# Patient Record
Sex: Female | Born: 1975 | Race: Black or African American | Hispanic: No | Marital: Married | Smoking: Never smoker
Health system: Southern US, Community
[De-identification: ages and names within clinical notes are randomized; demographics above are authoritative.]

## PROBLEM LIST (undated history)

## (undated) DIAGNOSIS — H209 Unspecified iridocyclitis: Secondary | ICD-10-CM

## (undated) DIAGNOSIS — G43909 Migraine, unspecified, not intractable, without status migrainosus: Secondary | ICD-10-CM

## (undated) DIAGNOSIS — E785 Hyperlipidemia, unspecified: Secondary | ICD-10-CM

## (undated) DIAGNOSIS — G4730 Sleep apnea, unspecified: Secondary | ICD-10-CM

## (undated) DIAGNOSIS — E039 Hypothyroidism, unspecified: Secondary | ICD-10-CM

## (undated) DIAGNOSIS — I10 Essential (primary) hypertension: Secondary | ICD-10-CM

## (undated) DIAGNOSIS — K219 Gastro-esophageal reflux disease without esophagitis: Secondary | ICD-10-CM

## (undated) DIAGNOSIS — R768 Other specified abnormal immunological findings in serum: Secondary | ICD-10-CM

## (undated) DIAGNOSIS — F419 Anxiety disorder, unspecified: Secondary | ICD-10-CM

## (undated) DIAGNOSIS — M1990 Unspecified osteoarthritis, unspecified site: Secondary | ICD-10-CM

## (undated) DIAGNOSIS — R011 Cardiac murmur, unspecified: Secondary | ICD-10-CM

## (undated) DIAGNOSIS — E282 Polycystic ovarian syndrome: Secondary | ICD-10-CM

## (undated) HISTORY — PX: DILATION AND CURETTAGE OF UTERUS: SHX78

## (undated) HISTORY — PX: PELVIC LAPAROSCOPY: SHX162

---

## 2014-04-08 ENCOUNTER — Emergency Department (HOSPITAL_COMMUNITY)
Admission: EM | Admit: 2014-04-08 | Discharge: 2014-04-09 | Disposition: A | Discharge status: Home / Self Care | Payer: Private Health Insurance - Indemnity | Attending: Emergency Medicine | Admitting: Emergency Medicine

## 2014-04-08 ENCOUNTER — Emergency Department (HOSPITAL_COMMUNITY): Payer: Private Health Insurance - Indemnity

## 2014-04-08 ENCOUNTER — Encounter (HOSPITAL_COMMUNITY): Payer: Self-pay | Admitting: *Deleted

## 2014-04-08 DIAGNOSIS — E669 Obesity, unspecified: Secondary | ICD-10-CM | POA: Diagnosis not present

## 2014-04-08 DIAGNOSIS — Z79899 Other long term (current) drug therapy: Secondary | ICD-10-CM | POA: Insufficient documentation

## 2014-04-08 DIAGNOSIS — E039 Hypothyroidism, unspecified: Secondary | ICD-10-CM | POA: Insufficient documentation

## 2014-04-08 DIAGNOSIS — R1031 Right lower quadrant pain: Secondary | ICD-10-CM | POA: Diagnosis present

## 2014-04-08 DIAGNOSIS — R1030 Lower abdominal pain, unspecified: Secondary | ICD-10-CM

## 2014-04-08 DIAGNOSIS — Z3202 Encounter for pregnancy test, result negative: Secondary | ICD-10-CM | POA: Diagnosis not present

## 2014-04-08 DIAGNOSIS — N390 Urinary tract infection, site not specified: Secondary | ICD-10-CM | POA: Insufficient documentation

## 2014-04-08 DIAGNOSIS — G43909 Migraine, unspecified, not intractable, without status migrainosus: Secondary | ICD-10-CM | POA: Diagnosis not present

## 2014-04-08 HISTORY — DX: Polycystic ovarian syndrome: E28.2

## 2014-04-08 HISTORY — DX: Hypothyroidism, unspecified: E03.9

## 2014-04-08 HISTORY — DX: Migraine, unspecified, not intractable, without status migrainosus: G43.909

## 2014-04-08 LAB — CBC WITH DIFFERENTIAL/PLATELET
Basophils Absolute: 0 10*3/uL (ref 0.0–0.1)
Basophils Relative: 0 % (ref 0–1)
EOS PCT: 1 % (ref 0–5)
Eosinophils Absolute: 0.1 10*3/uL (ref 0.0–0.7)
HEMATOCRIT: 36 % (ref 36.0–46.0)
HEMOGLOBIN: 12.3 g/dL (ref 12.0–15.0)
LYMPHS ABS: 2.9 10*3/uL (ref 0.7–4.0)
LYMPHS PCT: 32 % (ref 12–46)
MCH: 30.5 pg (ref 26.0–34.0)
MCHC: 34.2 g/dL (ref 30.0–36.0)
MCV: 89.3 fL (ref 78.0–100.0)
MONO ABS: 0.7 10*3/uL (ref 0.1–1.0)
MONOS PCT: 7 % (ref 3–12)
NEUTROS ABS: 5.5 10*3/uL (ref 1.7–7.7)
Neutrophils Relative %: 60 % (ref 43–77)
Platelets: 366 10*3/uL (ref 150–400)
RBC: 4.03 MIL/uL (ref 3.87–5.11)
RDW: 13.7 % (ref 11.5–15.5)
WBC: 9.1 10*3/uL (ref 4.0–10.5)

## 2014-04-08 LAB — URINE MICROSCOPIC-ADD ON

## 2014-04-08 LAB — WET PREP, GENITAL
Clue Cells Wet Prep HPF POC: NONE SEEN
Trich, Wet Prep: NONE SEEN
Yeast Wet Prep HPF POC: NONE SEEN

## 2014-04-08 LAB — URINALYSIS, ROUTINE W REFLEX MICROSCOPIC
Bilirubin Urine: NEGATIVE
Glucose, UA: NEGATIVE mg/dL
KETONES UR: NEGATIVE mg/dL
NITRITE: NEGATIVE
PROTEIN: NEGATIVE mg/dL
Specific Gravity, Urine: 1.02 (ref 1.005–1.030)
UROBILINOGEN UA: 1 mg/dL (ref 0.0–1.0)
pH: 6 (ref 5.0–8.0)

## 2014-04-08 LAB — COMPREHENSIVE METABOLIC PANEL
ALBUMIN: 3.7 g/dL (ref 3.5–5.2)
ALK PHOS: 52 U/L (ref 39–117)
ALT: 18 U/L (ref 0–35)
ANION GAP: 7 (ref 5–15)
AST: 26 U/L (ref 0–37)
BUN: 9 mg/dL (ref 6–23)
CHLORIDE: 107 meq/L (ref 96–112)
CO2: 25 mmol/L (ref 19–32)
CREATININE: 1.15 mg/dL — AB (ref 0.50–1.10)
Calcium: 9.3 mg/dL (ref 8.4–10.5)
GFR calc Af Amer: 69 mL/min — ABNORMAL LOW (ref >90)
GFR calc non Af Amer: 60 mL/min — ABNORMAL LOW (ref >90)
Glucose, Bld: 114 mg/dL — ABNORMAL HIGH (ref 70–99)
Potassium: 3.3 mmol/L — ABNORMAL LOW (ref 3.5–5.1)
Sodium: 139 mmol/L (ref 135–145)
TOTAL PROTEIN: 8 g/dL (ref 6.0–8.3)
Total Bilirubin: 0.3 mg/dL (ref 0.3–1.2)

## 2014-04-08 LAB — POC URINE PREG, ED: Preg Test, Ur: NEGATIVE

## 2014-04-08 LAB — LIPASE, BLOOD: Lipase: 43 U/L (ref 11–59)

## 2014-04-08 MED ORDER — CEPHALEXIN 500 MG PO CAPS: 500.0000 mg | ORAL_CAPSULE | Freq: Two times a day (BID) | ORAL | Status: DC

## 2014-04-08 MED ORDER — IOHEXOL 300 MG/ML  SOLN
25.0000 mL | Freq: Once | INTRAMUSCULAR | Status: AC | PRN
  Administered 2014-04-08: 25 mL via ORAL

## 2014-04-08 MED ORDER — IOHEXOL 300 MG/ML  SOLN
100.0000 mL | Freq: Once | INTRAMUSCULAR | Status: AC | PRN
  Administered 2014-04-08: 100 mL via INTRAVENOUS

## 2014-04-08 MED ORDER — HYDROCODONE-ACETAMINOPHEN 5-325 MG PO TABS: 1.0000 | ORAL_TABLET | Freq: Four times a day (QID) | ORAL | Status: DC | PRN

## 2014-04-08 MED ORDER — MORPHINE SULFATE 4 MG/ML IJ SOLN
4.0000 mg | Freq: Once | INTRAMUSCULAR | Status: AC
Start: 2014-04-08 — End: 2014-04-08
  Administered 2014-04-08: 4 mg via INTRAVENOUS
  Filled 2014-04-08: qty 1

## 2014-04-08 MED ORDER — SODIUM CHLORIDE 0.9 % IV BOLUS (SEPSIS)
1000.0000 mL | Freq: Once | INTRAVENOUS | Status: AC
  Administered 2014-04-08: 1000 mL via INTRAVENOUS

## 2014-04-08 NOTE — ED Provider Notes (Signed)
CSN: [id number]     Arrival date & time 04/08/14  1827 History   First MD Initiated Contact with Patient 04/08/14 1928     Chief Complaint  Patient presents with  . Abdominal Pain     (Consider location/radiation/quality/duration/timing/severity/associated sxs/prior Treatment) Patient is a 39 y.o. female presenting with abdominal pain. The history is provided by the patient and the spouse.  Abdominal Pain Pain location:  Suprapubic and RLQ Pain quality: aching and sharp   Pain radiates to:  Does not radiate Pain severity:  Moderate Onset quality:  Unable to specify Duration: 2.5 weeks. Timing:  Constant Progression:  Unchanged Chronicity:  New Context: not recent travel, not retching and not sick contacts   Relieved by:  Nothing Worsened by:  Nothing tried Ineffective treatments:  None tried Associated symptoms: anorexia, nausea and vaginal bleeding   Associated symptoms: no chest pain, no chills, no constipation, no cough, no diarrhea, no dysuria, no fatigue, no fever, no shortness of breath, no sore throat and no vomiting   Vaginal bleeding:    Quality:  Spotting   Duration: 2.5 weeks.   Progression:  Unchanged   Chronicity:  New Risk factors: obesity   Risk factors comment:  PCOS   Past Medical History  Diagnosis Date  . PCOS (polycystic ovarian syndrome)   . Hypothyroid   . Migraine    Past Surgical History  Procedure Laterality Date  . Cesarean section    . Dilation and curettage of uterus     History reviewed. No pertinent family history. History  Substance Use Topics  . Smoking status: Never Smoker   . Smokeless tobacco: Not on file  . Alcohol Use: No   OB History    No data available     Review of Systems  Constitutional: Negative for fever, chills, diaphoresis, activity change, appetite change and fatigue.  HENT: Negative for congestion, facial swelling, rhinorrhea and sore throat.   Eyes: Negative for photophobia and discharge.  Respiratory:  Negative for cough, chest tightness and shortness of breath.   Cardiovascular: Negative for chest pain, palpitations and leg swelling.  Gastrointestinal: Positive for nausea, abdominal pain and anorexia. Negative for vomiting, diarrhea and constipation.  Endocrine: Negative for polydipsia and polyuria.  Genitourinary: Positive for vaginal bleeding. Negative for dysuria, frequency, difficulty urinating and pelvic pain.  Musculoskeletal: Negative for back pain, arthralgias, neck pain and neck stiffness.  Skin: Negative for color change and wound.  Allergic/Immunologic: Negative for immunocompromised state.  Neurological: Negative for facial asymmetry, weakness, numbness and headaches.  Hematological: Does not bruise/bleed easily.  Psychiatric/Behavioral: Negative for confusion and agitation.      Allergies  Strawberry and Sulfa antibiotics  Home Medications   Prior to Admission medications   Medication Sig Start Date End Date Taking? Authorizing Provider  cephALEXin (KEFLEX) 500 MG capsule Take 1 capsule (500 mg total) by mouth 2 (two) times daily. For 7 days 04/08/14   Ernestina Patches, MD  Cholecalciferol ([employer] VIT D 5000 HIGH-POTENCY) 5000 UNITS capsule Take 5,000 Units by mouth daily.   Yes Historical Provider, MD  glucosamine-chondroitin 500-400 MG tablet Take 1 tablet by mouth daily.   Yes Historical Provider, MD  HYDROcodone-acetaminophen (NORCO) 5-325 MG per tablet Take 1-2 tablets by mouth every 6 (six) hours as needed. 04/08/14   Ernestina Patches, MD  levothyroxine (SYNTHROID, LEVOTHROID) 125 MCG tablet Take 62.5 mcg by mouth daily before breakfast.   Yes Historical Provider, MD  metFORMIN (GLUCOPHAGE) 500 MG tablet Take 500 mg by mouth  2 (two) times daily with a meal.   Yes Historical Provider, MD  Multiple Vitamin (MULTIVITAMIN WITH MINERALS) TABS tablet Take 1 tablet by mouth daily.   Yes Historical Provider, MD  QUASENSE 0.15-0.03 MG tablet Take 1 tablet by mouth daily. 01/02/14  Yes  Historical Provider, MD  spironolactone (ALDACTONE) 100 MG tablet Take 100 mg by mouth at bedtime.   Yes Historical Provider, MD  SUMAtriptan (IMITREX) 25 MG tablet Take 25 mg by mouth every 2 (two) hours as needed for migraine or headache. May repeat in 2 hours if headache persists or recurs.    Historical Provider, MD  topiramate (TOPAMAX) 25 MG tablet Take 75 mg by mouth at bedtime.   Yes Historical Provider, MD  VANIQA 13.9 % cream Apply 1 application topically 2 (two) times daily. 12/30/13  Yes Historical Provider, MD   BP 120/62 mmHg  Pulse 69  Temp(Src) 98.6 F (37 C)  Resp 18  SpO2 100%  LMP 04/08/2014 Physical Exam  Constitutional: She is oriented to person, place, and time. She appears well-developed and well-nourished. No distress.  HENT:  Head: Normocephalic and atraumatic.  Mouth/Throat: No oropharyngeal exudate.  Eyes: Pupils are equal, round, and reactive to light.  Neck: Normal range of motion. Neck supple.  Cardiovascular: Normal rate, regular rhythm and normal heart sounds.  Exam reveals no gallop and no friction rub.   No murmur heard. Pulmonary/Chest: Effort normal and breath sounds normal. No respiratory distress. She has no wheezes. She has no rales.  Abdominal: Soft. Bowel sounds are normal. She exhibits no distension and no mass. There is tenderness in the right lower quadrant and suprapubic area. There is no rebound and no guarding.  Genitourinary: Uterus is not tender. Cervix exhibits no motion tenderness, no discharge and no friability. Right adnexum displays tenderness (mild). Right adnexum displays no mass and no fullness. Left adnexum displays no mass, no tenderness and no fullness. There is bleeding (dark brown menstual appearing blood from closed os) in the vagina.  Musculoskeletal: Normal range of motion. She exhibits no edema or tenderness.  Neurological: She is alert and oriented to person, place, and time.  Skin: Skin is warm and dry.  Psychiatric: She  has a normal mood and affect.    ED Course  Procedures (including critical care time) Labs Review Labs Reviewed  WET PREP, GENITAL - Abnormal; Notable for the following:    WBC, Wet Prep HPF POC MODERATE (*)    All other components within normal limits  COMPREHENSIVE METABOLIC PANEL - Abnormal; Notable for the following:    Potassium 3.3 (*)    Glucose, Bld 114 (*)    Creatinine, Ser 1.15 (*)    GFR calc non Af Amer 60 (*)    GFR calc Af Amer 69 (*)    All other components within normal limits  URINALYSIS, ROUTINE W REFLEX MICROSCOPIC - Abnormal; Notable for the following:    APPearance CLOUDY (*)    Hgb urine dipstick LARGE (*)    Leukocytes, UA MODERATE (*)    All other components within normal limits  URINE MICROSCOPIC-ADD ON - Abnormal; Notable for the following:    Squamous Epithelial / LPF MANY (*)    Bacteria, UA FEW (*)    All other components within normal limits  GC/CHLAMYDIA PROBE AMP  CBC WITH DIFFERENTIAL  LIPASE, BLOOD  POC URINE PREG, ED    Imaging Review Ct Abdomen Pelvis W Contrast  04/08/2014   CLINICAL DATA:  Acute onset of  lower abdominal pain and right-sided abdominal pain. Initial encounter.  EXAM: CT ABDOMEN AND PELVIS WITH CONTRAST  TECHNIQUE: Multidetector CT imaging of the abdomen and pelvis was performed using the standard protocol following bolus administration of intravenous contrast.  CONTRAST:  148mL OMNIPAQUE IOHEXOL 300 MG/ML  SOLN  COMPARISON:  None.  FINDINGS: The visualized lung bases are clear.  The liver and spleen are unremarkable in appearance. The gallbladder is within normal limits. The pancreas and adrenal glands are unremarkable.  The kidneys are unremarkable in appearance. There is no evidence of hydronephrosis. No renal or ureteral stones are seen. No perinephric stranding is appreciated.  No free fluid is identified. The small bowel is unremarkable in appearance. The stomach is within normal limits. No acute vascular abnormalities are  seen. Visualized mediastinal nodes remain borderline normal in size.  The appendix is normal in caliber and contains air, without evidence for appendicitis. The colon is unremarkable in appearance.  The bladder is mildly distended and grossly unremarkable. The uterus is within normal limits. The ovaries are relatively symmetric. No suspicious adnexal masses are seen. No inguinal lymphadenopathy is seen.  No acute osseous abnormalities are identified.  IMPRESSION: No acute abnormality seen within the abdomen or pelvis.   Electronically Signed   By: Garald Balding M.D.   On: 04/08/2014 22:32     EKG Interpretation None      MDM   Final diagnoses:  Lower abdominal pain  RLQ abdominal pain  UTI (lower urinary tract infection)    Pt is a 39 y.o. female with Pmhx as above who presents with 2 weeks of constant suprapubic and right lower quadrant pain described as dull aching pains with occasional sharp pains.  She's had nausea but no vomiting.  She has had vaginal spotting but no other discharge.  She has early [place] when eating.  On physical exam, vital signs are stable.  Patient is in acute distress.  She is positive suprapubic and right lower quadrant tenderness without rebound or guarding.  On pelvic exam she has dark-appearing menstrual blood from a closed loss.  She has minimal right adnexal tenderness and otherwise unremarkable Pelvic exam.  Urine likely infected. CT w/o acute findings. Given adnexal tenderness and no CT findings, will get TV US. Dr Lita Mains will follow and will d/c home w/ rx for keflex if normal.     Ernestina Patches, MD 04/09/14 [phone]

## 2014-04-08 NOTE — ED Notes (Signed)
Started on new birthcontrol in October 2015; starting having lower abd. Pains as well as rt. Sided. Thinking it was like menstrual cycle.

## 2014-04-08 NOTE — Discharge Instructions (Signed)
Abdominal Pain, Women Abdominal (stomach, pelvic, or belly) pain can be caused by many things. It is important to tell your doctor:  The location of the pain.  Does it come and go or is it present all the time?  Are there things that start the pain (eating certain foods, exercise)?  Are there other symptoms associated with the pain (fever, nausea, vomiting, diarrhea)? All of this is helpful to know when trying to find the cause of the pain. CAUSES   Stomach: virus or bacteria infection, or ulcer.  Intestine: appendicitis (inflamed appendix), regional ileitis (Crohn's disease), ulcerative colitis (inflamed colon), irritable bowel syndrome, diverticulitis (inflamed diverticulum of the colon), or cancer of the stomach or intestine.  Gallbladder disease or stones in the gallbladder.  Kidney disease, kidney stones, or infection.  Pancreas infection or cancer.  Fibromyalgia (pain disorder).  Diseases of the female organs:  Uterus: fibroid (non-cancerous) tumors or infection.  Fallopian tubes: infection or tubal pregnancy.  Ovary: cysts or tumors.  Pelvic adhesions (scar tissue).  Endometriosis (uterus lining tissue growing in the pelvis and on the pelvic organs).  Pelvic congestion syndrome (female organs filling up with blood just before the menstrual period).  Pain with the menstrual period.  Pain with ovulation (producing an egg).  Pain with an IUD (intrauterine device, birth control) in the uterus.  Cancer of the female organs.  Functional pain (pain not caused by a disease, may improve without treatment).  Psychological pain.  Depression. DIAGNOSIS  Your doctor will decide the seriousness of your pain by doing an examination.  Blood tests.  X-rays.  Ultrasound.  CT scan (computed tomography, special type of X-ray).  MRI (magnetic resonance imaging).  Cultures, for infection.  Barium enema (dye inserted in the large intestine, to better view it with  X-rays).  Colonoscopy (looking in intestine with a lighted tube).  Laparoscopy (minor surgery, looking in abdomen with a lighted tube).  Major abdominal exploratory surgery (looking in abdomen with a large incision). TREATMENT  The treatment will depend on the cause of the pain.   Many cases can be observed and treated at home.  Over-the-counter medicines recommended by your caregiver.  Prescription medicine.  Antibiotics, for infection.  Birth control pills, for painful periods or for ovulation pain.  Hormone treatment, for endometriosis.  Nerve blocking injections.  Physical therapy.  Antidepressants.  Counseling with a psychologist or psychiatrist.  Minor or major surgery. HOME CARE INSTRUCTIONS   Do not take laxatives, unless directed by your caregiver.  Take over-the-counter pain medicine only if ordered by your caregiver. Do not take aspirin because it can cause an upset stomach or bleeding.  Try a clear liquid diet (broth or water) as ordered by your caregiver. Slowly move to a bland diet, as tolerated, if the pain is related to the stomach or intestine.  Have a thermometer and take your temperature several times a day, and record it.  Bed rest and sleep, if it helps the pain.  Avoid sexual intercourse, if it causes pain.  Avoid stressful situations.  Keep your follow-up appointments and tests, as your caregiver orders.  If the pain does not go away with medicine or surgery, you may try:  Acupuncture.  Relaxation exercises (yoga, meditation).  Group therapy.  Counseling. SEEK MEDICAL CARE IF:   You notice certain foods cause stomach pain.  Your home care treatment is not helping your pain.  You need stronger pain medicine.  You want your IUD removed.  You feel faint or  lightheaded. °· You develop nausea and vomiting. °· You develop a rash. °· You are having side effects or an allergy to your medicine. °SEEK IMMEDIATE MEDICAL CARE IF:  °· Your  pain does not go away or gets worse. °· You have a fever. °· Your pain is felt only in portions of the abdomen. The right side could possibly be appendicitis. The left lower portion of the abdomen could be colitis or diverticulitis. °· You are passing blood in your stools (bright red or black tarry stools, with or without vomiting). °· You have blood in your urine. °· You develop chills, with or without a fever. °· You pass out. °MAKE SURE YOU:  °· Understand these instructions. °· Will watch your condition. °· Will get help right away if you are not doing well or get worse. °Document Released: 01/09/2007 Document Revised: 07/29/2013 Document Reviewed: 01/29/2009 °ExitCare® Patient Information ©2015 [employer]. This information is not intended to replace advice given to you by your health care provider. Make sure you discuss any questions you have with your health care provider. ° °Urinary Tract Infection °Urinary tract infections (UTIs) can develop anywhere along your urinary tract. Your urinary tract is your body's drainage system for removing wastes and extra water. Your urinary tract includes two kidneys, two ureters, a bladder, and a urethra. Your kidneys are a pair of bean-shaped organs. Each kidney is about the size of your fist. They are located below your ribs, one on each side of your spine. °CAUSES °Infections are caused by microbes, which are microscopic organisms, including fungi, viruses, and bacteria. These organisms are so small that they can only be seen through a microscope. Bacteria are the microbes that most commonly cause UTIs. °SYMPTOMS  °Symptoms of UTIs may vary by age and gender of the patient and by the location of the infection. Symptoms in young women typically include a frequent and intense urge to urinate and a painful, burning feeling in the bladder or urethra during urination. Older women and men are more likely to be tired, shaky, and weak and have muscle aches and abdominal pain.  A fever may mean the infection is in your kidneys. Other symptoms of a kidney infection include pain in your back or sides below the ribs, nausea, and vomiting. °DIAGNOSIS °To diagnose a UTI, your caregiver will ask you about your symptoms. Your caregiver also will ask to provide a urine sample. The urine sample will be tested for bacteria and white blood cells. White blood cells are made by your body to help fight infection. °TREATMENT  °Typically, UTIs can be treated with medication. Because most UTIs are caused by a bacterial infection, they usually can be treated with the use of antibiotics. The choice of antibiotic and length of treatment depend on your symptoms and the type of bacteria causing your infection. °HOME CARE INSTRUCTIONS °· If you were prescribed antibiotics, take them exactly as your caregiver instructs you. Finish the medication even if you feel better after you have only taken some of the medication. °· Drink enough water and fluids to keep your urine clear or pale yellow. °· Avoid caffeine, tea, and carbonated beverages. They tend to irritate your bladder. °· Empty your bladder often. Avoid holding urine for long periods of time. °· Empty your bladder before and after sexual intercourse. °· After a bowel movement, women should cleanse from front to back. Use each tissue only once. °SEEK MEDICAL CARE IF:  °· You have back pain. °· You develop   a fever. °· Your symptoms do not begin to resolve within 3 days. °SEEK IMMEDIATE MEDICAL CARE IF:  °· You have severe back pain or lower abdominal pain. °· You develop chills. °· You have nausea or vomiting. °· You have continued burning or discomfort with urination. °MAKE SURE YOU:  °· Understand these instructions. °· Will watch your condition. °· Will get help right away if you are not doing well or get worse. °Document Released: 12/22/2004 Document Revised: 09/13/2011 Document Reviewed: 04/22/2011 °ExitCare® Patient Information ©2015 [employer]. This  information is not intended to replace advice given to you by your health care provider. Make sure you discuss any questions you have with your health care provider. ° °

## 2014-04-10 LAB — GC/CHLAMYDIA PROBE AMP
CT Probe RNA: NEGATIVE
GC Probe RNA: NEGATIVE

## 2014-06-19 ENCOUNTER — Ambulatory Visit: Payer: Self-pay | Admitting: Internal Medicine

## 2015-05-12 ENCOUNTER — Other Ambulatory Visit: Payer: Self-pay | Admitting: Obstetrics and Gynecology

## 2015-05-12 DIAGNOSIS — Z1231 Encounter for screening mammogram for malignant neoplasm of breast: Secondary | ICD-10-CM

## 2015-05-20 ENCOUNTER — Ambulatory Visit
Admission: RE | Admit: 2015-05-20 | Discharge: 2015-05-20 | Disposition: A | Discharge status: Home / Self Care | Payer: BLUE CROSS/BLUE SHIELD | Source: Ambulatory Visit | Attending: Obstetrics and Gynecology | Admitting: Obstetrics and Gynecology

## 2015-05-20 DIAGNOSIS — Z1231 Encounter for screening mammogram for malignant neoplasm of breast: Secondary | ICD-10-CM | POA: Diagnosis present

## 2015-09-15 ENCOUNTER — Other Ambulatory Visit: Payer: Self-pay

## 2015-09-15 ENCOUNTER — Emergency Department (HOSPITAL_COMMUNITY): Payer: 59

## 2015-09-15 ENCOUNTER — Emergency Department (HOSPITAL_COMMUNITY)
Admission: EM | Admit: 2015-09-15 | Discharge: 2015-09-15 | Disposition: A | Discharge status: Home / Self Care | Payer: 59 | Attending: Emergency Medicine | Admitting: Emergency Medicine

## 2015-09-15 ENCOUNTER — Encounter (HOSPITAL_COMMUNITY): Payer: Self-pay

## 2015-09-15 DIAGNOSIS — E039 Hypothyroidism, unspecified: Secondary | ICD-10-CM | POA: Insufficient documentation

## 2015-09-15 DIAGNOSIS — R079 Chest pain, unspecified: Secondary | ICD-10-CM | POA: Insufficient documentation

## 2015-09-15 DIAGNOSIS — R112 Nausea with vomiting, unspecified: Secondary | ICD-10-CM

## 2015-09-15 DIAGNOSIS — Z79899 Other long term (current) drug therapy: Secondary | ICD-10-CM | POA: Insufficient documentation

## 2015-09-15 DIAGNOSIS — R52 Pain, unspecified: Secondary | ICD-10-CM

## 2015-09-15 DIAGNOSIS — Z7984 Long term (current) use of oral hypoglycemic drugs: Secondary | ICD-10-CM | POA: Insufficient documentation

## 2015-09-15 LAB — COMPREHENSIVE METABOLIC PANEL
ALK PHOS: 63 U/L (ref 38–126)
ALT: 21 U/L (ref 14–54)
AST: 24 U/L (ref 15–41)
Albumin: 3.6 g/dL (ref 3.5–5.0)
Anion gap: 8 (ref 5–15)
BILIRUBIN TOTAL: 0.6 mg/dL (ref 0.3–1.2)
BUN: 10 mg/dL (ref 6–20)
CALCIUM: 9.6 mg/dL (ref 8.9–10.3)
CO2: 24 mmol/L (ref 22–32)
CREATININE: 1.02 mg/dL — AB (ref 0.44–1.00)
Chloride: 105 mmol/L (ref 101–111)
Glucose, Bld: 108 mg/dL — ABNORMAL HIGH (ref 65–99)
Potassium: 3.9 mmol/L (ref 3.5–5.1)
Sodium: 137 mmol/L (ref 135–145)
TOTAL PROTEIN: 7.8 g/dL (ref 6.5–8.1)

## 2015-09-15 LAB — CBC WITH DIFFERENTIAL/PLATELET
BASOS ABS: 0 10*3/uL (ref 0.0–0.1)
BASOS PCT: 0 %
EOS ABS: 0.1 10*3/uL (ref 0.0–0.7)
EOS PCT: 1 %
HCT: 38.8 % (ref 36.0–46.0)
Hemoglobin: 12.6 g/dL (ref 12.0–15.0)
LYMPHS PCT: 28 %
Lymphs Abs: 2.1 10*3/uL (ref 0.7–4.0)
MCH: 30.1 pg (ref 26.0–34.0)
MCHC: 32.5 g/dL (ref 30.0–36.0)
MCV: 92.6 fL (ref 78.0–100.0)
MONO ABS: 0.8 10*3/uL (ref 0.1–1.0)
Monocytes Relative: 11 %
Neutro Abs: 4.4 10*3/uL (ref 1.7–7.7)
Neutrophils Relative %: 60 %
PLATELETS: 364 10*3/uL (ref 150–400)
RBC: 4.19 MIL/uL (ref 3.87–5.11)
RDW: 14.2 % (ref 11.5–15.5)
WBC: 7.4 10*3/uL (ref 4.0–10.5)

## 2015-09-15 LAB — PREGNANCY, URINE: Preg Test, Ur: NEGATIVE

## 2015-09-15 LAB — URINALYSIS, ROUTINE W REFLEX MICROSCOPIC
Bilirubin Urine: NEGATIVE
GLUCOSE, UA: NEGATIVE mg/dL
HGB URINE DIPSTICK: NEGATIVE
KETONES UR: NEGATIVE mg/dL
LEUKOCYTES UA: NEGATIVE
Nitrite: NEGATIVE
PROTEIN: NEGATIVE mg/dL
Specific Gravity, Urine: 1.011 (ref 1.005–1.030)
pH: 7.5 (ref 5.0–8.0)

## 2015-09-15 LAB — TROPONIN I

## 2015-09-15 LAB — LIPASE, BLOOD: LIPASE: 29 U/L (ref 11–51)

## 2015-09-15 MED ORDER — ONDANSETRON HCL 4 MG/2ML IJ SOLN
4.0000 mg | Freq: Once | INTRAMUSCULAR | Status: AC
  Administered 2015-09-15: 4 mg via INTRAVENOUS
  Filled 2015-09-15: qty 2

## 2015-09-15 MED ORDER — HYDROCODONE-ACETAMINOPHEN 5-325 MG PO TABS: 1.0000 | ORAL_TABLET | ORAL | Status: DC | PRN

## 2015-09-15 MED ORDER — PROCHLORPERAZINE EDISYLATE 5 MG/ML IJ SOLN
10.0000 mg | Freq: Once | INTRAMUSCULAR | Status: AC
  Administered 2015-09-15: 10 mg via INTRAVENOUS
  Filled 2015-09-15: qty 2

## 2015-09-15 MED ORDER — FAMOTIDINE 20 MG PO TABS: 20.0000 mg | ORAL_TABLET | Freq: Two times a day (BID) | ORAL | Status: DC

## 2015-09-15 MED ORDER — SODIUM CHLORIDE 0.9 % IV BOLUS (SEPSIS)
1000.0000 mL | Freq: Once | INTRAVENOUS | Status: AC
  Administered 2015-09-15: 1000 mL via INTRAVENOUS

## 2015-09-15 MED ORDER — ONDANSETRON HCL 4 MG PO TABS: 4.0000 mg | ORAL_TABLET | Freq: Four times a day (QID) | ORAL | Status: AC

## 2015-09-15 MED ORDER — MORPHINE SULFATE (PF) 4 MG/ML IV SOLN
4.0000 mg | Freq: Once | INTRAVENOUS | Status: AC
  Administered 2015-09-15: 4 mg via INTRAVENOUS
  Filled 2015-09-15: qty 1

## 2015-09-15 NOTE — ED Notes (Signed)
Pt. Coming from home via POV for chest pain and vomiting that started at 0630 this morning. Pt. Vomiting in ED room. Pt. C/o right sided chest pain, but denies SOB. EDP at bedside.

## 2015-09-15 NOTE — ED Notes (Signed)
Pt. Transported to xray at this time.  

## 2015-09-15 NOTE — ED Provider Notes (Signed)
CSN: [id number]     Arrival date & time 09/15/15  0800 History   First MD Initiated Contact with Patient 09/15/15 [phone]     Chief Complaint  Patient presents with  . Chest Pain  . Emesis   PT IS A 40 YO BF WHO PRESENTS TO THE ED WITH CP AND VOMITING.  SX STARTED AT 0630.  THE PT C/O RIGHT SIDED CP.  (Consider location/radiation/quality/duration/timing/severity/associated sxs/prior Treatment) Patient is a 40 y.o. female presenting with chest pain and vomiting. The history is provided by the patient.  Chest Pain Pain location:  R chest Pain quality: sharp   Pain radiates to:  Epigastrium Pain radiates to the back: no   Pain severity:  Moderate Onset quality:  Sudden Timing:  Constant Progression:  Worsening Chronicity:  New Relieved by:  Nothing Associated symptoms: abdominal pain, nausea and vomiting   Emesis Associated symptoms: abdominal pain     Past Medical History  Diagnosis Date  . PCOS (polycystic ovarian syndrome)   . Hypothyroid   . Migraine    Past Surgical History  Procedure Laterality Date  . Cesarean section    . Dilation and curettage of uterus     Family History  Problem Relation Age of Onset  . Breast cancer Mother    Social History  Substance Use Topics  . Smoking status: Never Smoker   . Smokeless tobacco: None  . Alcohol Use: No   OB History    No data available     Review of Systems  Cardiovascular: Positive for chest pain.  Gastrointestinal: Positive for nausea, vomiting and abdominal pain.  All other systems reviewed and are negative.     Allergies  Strawberry extract and Sulfa antibiotics  Home Medications   Prior to Admission medications   Medication Sig Start Date End Date Taking? Authorizing Provider  Cholecalciferol ([employer] VIT D 5000 HIGH-POTENCY) 5000 UNITS capsule Take 5,000 Units by mouth daily.   Yes Historical Provider, MD  glucosamine-chondroitin 500-400 MG tablet Take 1 tablet by mouth daily.   Yes Historical Provider,  MD  levothyroxine (SYNTHROID, LEVOTHROID) 125 MCG tablet Take 62.5 mcg by mouth daily before breakfast.   Yes Historical Provider, MD  metFORMIN (GLUCOPHAGE) 500 MG tablet Take 500 mg by mouth 2 (two) times daily with a meal.   Yes Historical Provider, MD  Multiple Vitamin (MULTIVITAMIN WITH MINERALS) TABS tablet Take 1 tablet by mouth daily.   Yes Historical Provider, MD  QUASENSE 0.15-0.03 MG tablet Take 1 tablet by mouth daily. 01/02/14  Yes Historical Provider, MD  spironolactone (ALDACTONE) 100 MG tablet Take 100 mg by mouth at bedtime.   Yes Historical Provider, MD  SUMAtriptan (IMITREX) 25 MG tablet Take 25 mg by mouth every 2 (two) hours as needed for migraine or headache. May repeat in 2 hours if headache persists or recurs.   Yes Historical Provider, MD  topiramate (TOPAMAX) 25 MG tablet Take 75 mg by mouth at bedtime.   Yes Historical Provider, MD  VANIQA 13.9 % cream Apply 1 application topically 2 (two) times daily. 12/30/13  Yes Historical Provider, MD  cephALEXin (KEFLEX) 500 MG capsule Take 1 capsule (500 mg total) by mouth 2 (two) times daily. For 7 days Patient not taking: Reported on 09/15/2015 04/08/14   Ernestina Patches, MD  famotidine (PEPCID) 20 MG tablet Take 1 tablet (20 mg total) by mouth 2 (two) times daily. 09/15/15   Isla Pence, MD  HYDROcodone-acetaminophen (NORCO/VICODIN) 5-325 MG tablet Take 1 tablet by mouth  every 4 (four) hours as needed. 09/15/15   Isla Pence, MD  ondansetron (ZOFRAN) 4 MG tablet Take 1 tablet (4 mg total) by mouth every 6 (six) hours. 09/15/15   Isla Pence, MD   BP 112/59 mmHg  Pulse 53  Temp(Src) 98.9 F (37.2 C)  Resp 15  Ht 5\' 6"  (1.676 m)  Wt 240 lb (108.863 kg)  BMI 38.76 kg/m2  SpO2 98%  LMP 08/21/2015 Physical Exam  Constitutional: She is oriented to person, place, and time. She appears well-developed and well-nourished. She appears distressed.  PT VOMITING ON EXAM  HENT:  Head: Normocephalic and atraumatic.  Right Ear:  External ear normal.  Left Ear: External ear normal.  Nose: Nose normal.  Mouth/Throat: Oropharynx is clear and moist.  Eyes: Conjunctivae and EOM are normal. Pupils are equal, round, and reactive to light.  Neck: Normal range of motion. Neck supple.  Cardiovascular: Normal rate, regular rhythm, normal heart sounds and intact distal pulses.   Pulmonary/Chest: Effort normal and breath sounds normal.  Abdominal: Soft. Bowel sounds are normal.  Musculoskeletal: Normal range of motion.  Neurological: She is alert and oriented to person, place, and time.  Skin: Skin is warm and dry.  Psychiatric: She has a normal mood and affect. Her behavior is normal. Judgment and thought content normal.  Nursing note and vitals reviewed.   ED Course  Procedures (including critical care time) Labs Review Labs Reviewed  COMPREHENSIVE METABOLIC PANEL - Abnormal; Notable for the following:    Glucose, Bld 108 (*)    Creatinine, Ser 1.02 (*)    All other components within normal limits  URINALYSIS, ROUTINE W REFLEX MICROSCOPIC (NOT AT [facility]) - Abnormal; Notable for the following:    APPearance CLOUDY (*)    All other components within normal limits  LIPASE, BLOOD  TROPONIN I  CBC WITH DIFFERENTIAL/PLATELET  PREGNANCY, URINE    Imaging Review Dg Chest 2 View  09/15/2015  CLINICAL DATA:  Shortness of breath, chest pain. EXAM: CHEST  2 VIEW COMPARISON:  None. FINDINGS: The heart size and mediastinal contours are within normal limits. Both lungs are clear. No pneumothorax or pleural effusion is noted. The visualized skeletal structures are unremarkable. IMPRESSION: No active cardiopulmonary disease. Electronically Signed   By: Marijo Conception, M.D.   On: 09/15/2015 08:55   US Abdomen Limited Ruq  09/15/2015  CLINICAL DATA:  Right upper quadrant and epigastric pain since 6:30 a.m. EXAM: US ABDOMEN LIMITED - RIGHT UPPER QUADRANT COMPARISON:  04/08/2014 CT scan FINDINGS: Gallbladder: No gallstones or wall  thickening visualized. No sonographic Murphy sign noted by sonographer. Common bile duct: Diameter: 4 mm Liver: No focal lesion identified. Within normal limits in parenchymal echogenicity. IMPRESSION: 1. Normal sonographic appearance of the hepatobiliary system. Electronically Signed   By: Van Clines M.D.   On: 09/15/2015 11:31   I have personally reviewed and evaluated these images and lab results as part of my medical decision-making.   EKG Interpretation None      MDM  PT SAID THAT SHE IS FEELING MUCH BETTER.  SHE KNOWS TO RETURN IF WORSE.  F/U WITH PCP. Final diagnoses:  Pain  Non-intractable vomiting with nausea, vomiting of unspecified type       Isla Pence, MD 09/15/15 1152

## 2015-09-15 NOTE — ED Notes (Signed)
Pt. Transported to ultrasound at this time.

## 2016-03-29 ENCOUNTER — Emergency Department
Admission: EM | Admit: 2016-03-29 | Discharge: 2016-03-29 | Disposition: A | Discharge status: Home / Self Care | Payer: Managed Care, Other (non HMO) | Attending: Emergency Medicine | Admitting: Emergency Medicine

## 2016-03-29 ENCOUNTER — Encounter: Payer: Self-pay | Admitting: Emergency Medicine

## 2016-03-29 DIAGNOSIS — S299XXA Unspecified injury of thorax, initial encounter: Secondary | ICD-10-CM | POA: Diagnosis present

## 2016-03-29 DIAGNOSIS — Y999 Unspecified external cause status: Secondary | ICD-10-CM | POA: Diagnosis not present

## 2016-03-29 DIAGNOSIS — R112 Nausea with vomiting, unspecified: Secondary | ICD-10-CM | POA: Diagnosis not present

## 2016-03-29 DIAGNOSIS — R51 Headache: Secondary | ICD-10-CM | POA: Insufficient documentation

## 2016-03-29 DIAGNOSIS — R0789 Other chest pain: Secondary | ICD-10-CM | POA: Diagnosis not present

## 2016-03-29 DIAGNOSIS — Z7984 Long term (current) use of oral hypoglycemic drugs: Secondary | ICD-10-CM | POA: Insufficient documentation

## 2016-03-29 DIAGNOSIS — Y9389 Activity, other specified: Secondary | ICD-10-CM | POA: Insufficient documentation

## 2016-03-29 DIAGNOSIS — M7918 Myalgia, other site: Secondary | ICD-10-CM

## 2016-03-29 DIAGNOSIS — Y92410 Unspecified street and highway as the place of occurrence of the external cause: Secondary | ICD-10-CM | POA: Insufficient documentation

## 2016-03-29 DIAGNOSIS — E039 Hypothyroidism, unspecified: Secondary | ICD-10-CM | POA: Insufficient documentation

## 2016-03-29 DIAGNOSIS — Z79899 Other long term (current) drug therapy: Secondary | ICD-10-CM | POA: Insufficient documentation

## 2016-03-29 MED ORDER — CYCLOBENZAPRINE HCL 5 MG PO TABS: 5.0000 mg | ORAL_TABLET | Freq: Three times a day (TID) | ORAL | 0 refills | Status: AC | PRN
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
## 2016-05-16 ENCOUNTER — Other Ambulatory Visit: Payer: Self-pay | Admitting: Obstetrics and Gynecology

## 2016-05-16 DIAGNOSIS — Z1231 Encounter for screening mammogram for malignant neoplasm of breast: Secondary | ICD-10-CM

## 2016-06-08 ENCOUNTER — Ambulatory Visit
Admission: RE | Admit: 2016-06-08 | Discharge: 2016-06-08 | Disposition: A | Discharge status: Home / Self Care | Payer: Managed Care, Other (non HMO) | Source: Ambulatory Visit | Attending: Obstetrics and Gynecology | Admitting: Obstetrics and Gynecology

## 2016-06-08 DIAGNOSIS — Z1231 Encounter for screening mammogram for malignant neoplasm of breast: Secondary | ICD-10-CM | POA: Diagnosis present

## 2017-03-27 ENCOUNTER — Other Ambulatory Visit: Payer: Self-pay | Admitting: Internal Medicine

## 2017-03-27 DIAGNOSIS — M545 Low back pain: Principal | ICD-10-CM

## 2017-03-27 DIAGNOSIS — G8929 Other chronic pain: Secondary | ICD-10-CM

## 2017-04-04 ENCOUNTER — Ambulatory Visit
Admission: RE | Admit: 2017-04-04 | Discharge: 2017-04-04 | Disposition: A | Discharge status: Home / Self Care | Payer: BLUE CROSS/BLUE SHIELD | Source: Ambulatory Visit | Attending: Internal Medicine | Admitting: Internal Medicine

## 2017-04-04 DIAGNOSIS — M160 Bilateral primary osteoarthritis of hip: Secondary | ICD-10-CM | POA: Insufficient documentation

## 2017-04-04 DIAGNOSIS — M545 Low back pain, unspecified: Secondary | ICD-10-CM

## 2017-04-04 DIAGNOSIS — G8929 Other chronic pain: Secondary | ICD-10-CM | POA: Insufficient documentation

## 2017-05-17 ENCOUNTER — Other Ambulatory Visit: Payer: Self-pay | Admitting: Obstetrics and Gynecology

## 2017-05-17 DIAGNOSIS — Z1231 Encounter for screening mammogram for malignant neoplasm of breast: Secondary | ICD-10-CM

## 2017-06-09 ENCOUNTER — Ambulatory Visit
Admission: RE | Admit: 2017-06-09 | Discharge: 2017-06-09 | Disposition: A | Discharge status: Home / Self Care | Payer: BLUE CROSS/BLUE SHIELD | Source: Ambulatory Visit | Attending: Obstetrics and Gynecology | Admitting: Obstetrics and Gynecology

## 2017-06-09 DIAGNOSIS — Z1231 Encounter for screening mammogram for malignant neoplasm of breast: Secondary | ICD-10-CM | POA: Insufficient documentation

## 2018-05-22 ENCOUNTER — Other Ambulatory Visit: Payer: Self-pay | Admitting: Obstetrics and Gynecology

## 2018-05-22 DIAGNOSIS — Z1231 Encounter for screening mammogram for malignant neoplasm of breast: Secondary | ICD-10-CM

## 2018-06-11 ENCOUNTER — Other Ambulatory Visit: Payer: Self-pay

## 2018-06-11 ENCOUNTER — Ambulatory Visit
Admission: RE | Admit: 2018-06-11 | Discharge: 2018-06-11 | Disposition: A | Discharge status: Home / Self Care | Payer: BLUE CROSS/BLUE SHIELD | Source: Ambulatory Visit | Attending: Obstetrics and Gynecology | Admitting: Obstetrics and Gynecology

## 2018-06-11 DIAGNOSIS — Z1231 Encounter for screening mammogram for malignant neoplasm of breast: Secondary | ICD-10-CM | POA: Diagnosis present

## 2018-09-07 ENCOUNTER — Other Ambulatory Visit: Payer: Self-pay

## 2018-09-07 ENCOUNTER — Encounter (HOSPITAL_BASED_OUTPATIENT_CLINIC_OR_DEPARTMENT_OTHER): Payer: Self-pay | Admitting: *Deleted

## 2018-09-12 ENCOUNTER — Other Ambulatory Visit: Payer: Self-pay

## 2018-09-12 ENCOUNTER — Encounter (HOSPITAL_BASED_OUTPATIENT_CLINIC_OR_DEPARTMENT_OTHER)
Admission: RE | Admit: 2018-09-12 | Discharge: 2018-09-12 | Disposition: A | Discharge status: Home / Self Care | Payer: BC Managed Care – PPO | Source: Ambulatory Visit | Attending: Plastic Surgery | Admitting: Plastic Surgery

## 2018-09-12 DIAGNOSIS — Z01812 Encounter for preprocedural laboratory examination: Secondary | ICD-10-CM | POA: Insufficient documentation

## 2018-09-12 LAB — BASIC METABOLIC PANEL
Anion gap: 7 (ref 5–15)
BUN: 12 mg/dL (ref 6–20)
CO2: 25 mmol/L (ref 22–32)
Calcium: 9.4 mg/dL (ref 8.9–10.3)
Chloride: 105 mmol/L (ref 98–111)
Creatinine, Ser: 1.03 mg/dL — ABNORMAL HIGH (ref 0.44–1.00)
GFR calc Af Amer: >60 mL/min (ref >60)
GFR calc non Af Amer: >60 mL/min (ref >60)
Glucose, Bld: 107 mg/dL — ABNORMAL HIGH (ref 70–99)
Potassium: 4.7 mmol/L (ref 3.5–5.1)
Sodium: 137 mmol/L (ref 135–145)

## 2018-09-12 NOTE — Progress Notes (Signed)
Surgical soap given with instructions, pt verbalized understanding.  

## 2018-09-20 ENCOUNTER — Ambulatory Visit: Payer: Self-pay | Admitting: Plastic Surgery

## 2018-09-22 ENCOUNTER — Other Ambulatory Visit (HOSPITAL_COMMUNITY)
Admission: RE | Admit: 2018-09-22 | Discharge: 2018-09-22 | Disposition: A | Discharge status: Home / Self Care | Payer: BC Managed Care – PPO | Source: Ambulatory Visit | Attending: Plastic Surgery | Admitting: Plastic Surgery

## 2018-09-22 DIAGNOSIS — Z1159 Encounter for screening for other viral diseases: Secondary | ICD-10-CM | POA: Insufficient documentation

## 2018-09-22 LAB — SARS CORONAVIRUS 2 (TAT 6-24 HRS): SARS Coronavirus 2: NEGATIVE

## 2018-09-24 ENCOUNTER — Other Ambulatory Visit (HOSPITAL_COMMUNITY): Payer: BC Managed Care – PPO

## 2018-09-25 NOTE — Anesthesia Preprocedure Evaluation (Addendum)
Anesthesia Evaluation  Patient identified by MRN, date of birth, ID band Patient awake    Reviewed: Allergy & Precautions, NPO status , Patient's Chart, lab work & pertinent test results  Airway Mallampati: II  TM Distance: >3 FB Neck ROM: Full    Dental no notable dental hx. (+) Teeth Intact, Dental Advisory Given   Pulmonary sleep apnea and Continuous Positive Airway Pressure Ventilation ,  Just started CpAp Last night   Pulmonary exam normal breath sounds clear to auscultation       Cardiovascular hypertension, Pt. on medications Normal cardiovascular exam Rhythm:Regular Rate:Normal     Neuro/Psych  Headaches, negative psych ROS   GI/Hepatic negative GI ROS, Neg liver ROS,   Endo/Other  negative endocrine ROSHypothyroidism   Renal/GU negative Renal ROS     Musculoskeletal  (+) Arthritis ,   Abdominal (+) + obese,   Peds  Hematology   Anesthesia Other Findings   Reproductive/Obstetrics                            Anesthesia Physical Anesthesia Plan  ASA: III  Anesthesia Plan: General   Post-op Pain Management:    Induction: Intravenous  PONV Risk Score and Plan: 4 or greater and Treatment may vary due to age or medical condition, Dexamethasone, Ondansetron, Scopolamine patch - Pre-op and Midazolam  Airway Management Planned: Oral ETT  Additional Equipment:   Intra-op Plan:   Post-operative Plan: Extubation in OR  Informed Consent: I have reviewed the patients History and Physical, chart, labs and discussed the procedure including the risks, benefits and alternatives for the proposed anesthesia with the patient or authorized representative who has indicated his/her understanding and acceptance.     Dental advisory given  Plan Discussed with: CRNA  Anesthesia Plan Comments: (GA w ETT)      Anesthesia Quick Evaluation

## 2018-09-26 ENCOUNTER — Ambulatory Visit (HOSPITAL_BASED_OUTPATIENT_CLINIC_OR_DEPARTMENT_OTHER): Payer: BC Managed Care – PPO | Admitting: Certified Registered"

## 2018-09-26 ENCOUNTER — Encounter (HOSPITAL_BASED_OUTPATIENT_CLINIC_OR_DEPARTMENT_OTHER)
Admission: RE | Disposition: A | Discharge status: Home / Self Care | Payer: Self-pay | Source: Ambulatory Visit | Attending: Plastic Surgery

## 2018-09-26 ENCOUNTER — Ambulatory Visit (HOSPITAL_BASED_OUTPATIENT_CLINIC_OR_DEPARTMENT_OTHER)
Admission: RE | Admit: 2018-09-26 | Discharge: 2018-09-26 | Disposition: A | Discharge status: Home / Self Care | Payer: BC Managed Care – PPO | Source: Ambulatory Visit | Attending: Plastic Surgery | Admitting: Plastic Surgery

## 2018-09-26 ENCOUNTER — Encounter (HOSPITAL_BASED_OUTPATIENT_CLINIC_OR_DEPARTMENT_OTHER): Payer: Self-pay | Admitting: Certified Registered"

## 2018-09-26 ENCOUNTER — Other Ambulatory Visit: Payer: Self-pay

## 2018-09-26 DIAGNOSIS — M3500 Sicca syndrome, unspecified: Secondary | ICD-10-CM | POA: Insufficient documentation

## 2018-09-26 DIAGNOSIS — N62 Hypertrophy of breast: Secondary | ICD-10-CM | POA: Insufficient documentation

## 2018-09-26 DIAGNOSIS — M542 Cervicalgia: Secondary | ICD-10-CM | POA: Insufficient documentation

## 2018-09-26 DIAGNOSIS — E039 Hypothyroidism, unspecified: Secondary | ICD-10-CM | POA: Diagnosis not present

## 2018-09-26 DIAGNOSIS — Z888 Allergy status to other drugs, medicaments and biological substances status: Secondary | ICD-10-CM | POA: Diagnosis not present

## 2018-09-26 DIAGNOSIS — I10 Essential (primary) hypertension: Secondary | ICD-10-CM | POA: Diagnosis not present

## 2018-09-26 DIAGNOSIS — E669 Obesity, unspecified: Secondary | ICD-10-CM | POA: Diagnosis not present

## 2018-09-26 DIAGNOSIS — G4730 Sleep apnea, unspecified: Secondary | ICD-10-CM | POA: Diagnosis not present

## 2018-09-26 DIAGNOSIS — M549 Dorsalgia, unspecified: Secondary | ICD-10-CM | POA: Diagnosis not present

## 2018-09-26 DIAGNOSIS — G43909 Migraine, unspecified, not intractable, without status migrainosus: Secondary | ICD-10-CM | POA: Diagnosis not present

## 2018-09-26 DIAGNOSIS — M1990 Unspecified osteoarthritis, unspecified site: Secondary | ICD-10-CM | POA: Diagnosis not present

## 2018-09-26 DIAGNOSIS — K219 Gastro-esophageal reflux disease without esophagitis: Secondary | ICD-10-CM | POA: Insufficient documentation

## 2018-09-26 HISTORY — PX: REDUCTION MAMMAPLASTY: SUR839

## 2018-09-26 HISTORY — DX: Essential (primary) hypertension: I10

## 2018-09-26 HISTORY — DX: Unspecified osteoarthritis, unspecified site: M19.90

## 2018-09-26 HISTORY — PX: BREAST REDUCTION SURGERY: SHX8

## 2018-09-26 LAB — POCT PREGNANCY, URINE: Preg Test, Ur: NEGATIVE

## 2018-09-26 SURGERY — MAMMOPLASTY, REDUCTION
Anesthesia: General | Site: Breast | Laterality: Bilateral

## 2018-09-26 MED ORDER — ACETAMINOPHEN 500 MG PO TABS
ORAL_TABLET | ORAL | Status: AC
  Filled 2018-09-26: qty 2

## 2018-09-26 MED ORDER — ACETAMINOPHEN 10 MG/ML IV SOLN: 1000.0000 mg | Freq: Once | INTRAVENOUS | Status: DC | PRN

## 2018-09-26 MED ORDER — SUCCINYLCHOLINE CHLORIDE 20 MG/ML IJ SOLN
INTRAMUSCULAR | Status: DC | PRN
  Administered 2018-09-26: 100 mg via INTRAVENOUS

## 2018-09-26 MED ORDER — ROCURONIUM BROMIDE 100 MG/10ML IV SOLN
INTRAVENOUS | Status: DC | PRN
  Administered 2018-09-26: 20 mg via INTRAVENOUS
  Administered 2018-09-26: 30 mg via INTRAVENOUS

## 2018-09-26 MED ORDER — BUPIVACAINE HCL (PF) 0.25 % IJ SOLN
INTRAMUSCULAR | Status: DC | PRN
  Administered 2018-09-26: 20 mL

## 2018-09-26 MED ORDER — LIDOCAINE-EPINEPHRINE 2 %-1:100000 IJ SOLN
INTRAMUSCULAR | Status: AC
  Filled 2018-09-26: qty 1

## 2018-09-26 MED ORDER — FENTANYL CITRATE (PF) 100 MCG/2ML IJ SOLN
INTRAMUSCULAR | Status: AC
  Filled 2018-09-26: qty 2

## 2018-09-26 MED ORDER — SUGAMMADEX SODIUM 500 MG/5ML IV SOLN
INTRAVENOUS | Status: AC
  Filled 2018-09-26: qty 5

## 2018-09-26 MED ORDER — ONDANSETRON HCL 4 MG/2ML IJ SOLN: 4.0000 mg | Freq: Once | INTRAMUSCULAR | Status: DC | PRN

## 2018-09-26 MED ORDER — ACETAMINOPHEN 500 MG PO TABS
1000.0000 mg | ORAL_TABLET | Freq: Once | ORAL | Status: AC
  Administered 2018-09-26: 1000 mg via ORAL

## 2018-09-26 MED ORDER — BUPIVACAINE LIPOSOME 1.3 % IJ SUSP
INTRAMUSCULAR | Status: AC
  Filled 2018-09-26: qty 20

## 2018-09-26 MED ORDER — 0.9 % SODIUM CHLORIDE (POUR BTL) OPTIME
TOPICAL | Status: DC | PRN
  Administered 2018-09-26: 1000 mL

## 2018-09-26 MED ORDER — BACITRACIN ZINC 500 UNIT/GM EX OINT
TOPICAL_OINTMENT | CUTANEOUS | Status: AC
  Filled 2018-09-26: qty 28.35

## 2018-09-26 MED ORDER — BUPIVACAINE LIPOSOME 1.3 % IJ SUSP
INTRAMUSCULAR | Status: DC | PRN
  Administered 2018-09-26: 20 mL

## 2018-09-26 MED ORDER — LIDOCAINE 2% (20 MG/ML) 5 ML SYRINGE
INTRAMUSCULAR | Status: AC
  Filled 2018-09-26: qty 15

## 2018-09-26 MED ORDER — ONDANSETRON HCL 4 MG/2ML IJ SOLN
INTRAMUSCULAR | Status: DC | PRN
  Administered 2018-09-26: 4 mg via INTRAVENOUS

## 2018-09-26 MED ORDER — DEXAMETHASONE SODIUM PHOSPHATE 4 MG/ML IJ SOLN
INTRAMUSCULAR | Status: DC | PRN
  Administered 2018-09-26: 10 mg via INTRAVENOUS

## 2018-09-26 MED ORDER — PROPOFOL 10 MG/ML IV BOLUS
INTRAVENOUS | Status: AC
  Filled 2018-09-26: qty 20

## 2018-09-26 MED ORDER — LACTATED RINGERS IV SOLN
INTRAVENOUS | Status: DC
  Administered 2018-09-26 (×3): via INTRAVENOUS

## 2018-09-26 MED ORDER — LIDOCAINE-EPINEPHRINE 1 %-1:100000 IJ SOLN
INTRAMUSCULAR | Status: AC
  Filled 2018-09-26: qty 1

## 2018-09-26 MED ORDER — FENTANYL CITRATE (PF) 100 MCG/2ML IJ SOLN
50.0000 ug | INTRAMUSCULAR | Status: AC | PRN
  Administered 2018-09-26: 100 ug via INTRAVENOUS
  Administered 2018-09-26 (×4): 50 ug via INTRAVENOUS

## 2018-09-26 MED ORDER — CEFAZOLIN SODIUM-DEXTROSE 2-4 GM/100ML-% IV SOLN
2.0000 g | INTRAVENOUS | Status: AC
  Administered 2018-09-26: 2 g via INTRAVENOUS

## 2018-09-26 MED ORDER — BACITRACIN ZINC 500 UNIT/GM EX OINT
TOPICAL_OINTMENT | CUTANEOUS | Status: DC | PRN
  Administered 2018-09-26: 1 via TOPICAL

## 2018-09-26 MED ORDER — CHLORHEXIDINE GLUCONATE CLOTH 2 % EX PADS: 6.0000 | MEDICATED_PAD | Freq: Once | CUTANEOUS | Status: DC

## 2018-09-26 MED ORDER — PROPOFOL 10 MG/ML IV BOLUS
INTRAVENOUS | Status: DC | PRN
  Administered 2018-09-26: 200 mg via INTRAVENOUS

## 2018-09-26 MED ORDER — HYDROCODONE-ACETAMINOPHEN 7.5-325 MG PO TABS: 1.0000 | ORAL_TABLET | Freq: Once | ORAL | Status: DC | PRN

## 2018-09-26 MED ORDER — ONDANSETRON HCL 4 MG/2ML IJ SOLN
INTRAMUSCULAR | Status: AC
  Filled 2018-09-26: qty 8

## 2018-09-26 MED ORDER — LIDOCAINE-EPINEPHRINE 1 %-1:100000 IJ SOLN
INTRAMUSCULAR | Status: DC | PRN
  Administered 2018-09-26: 20 mL

## 2018-09-26 MED ORDER — MIDAZOLAM HCL 2 MG/2ML IJ SOLN
1.0000 mg | INTRAMUSCULAR | Status: DC | PRN
  Administered 2018-09-26: 09:00:00 2 mg via INTRAVENOUS

## 2018-09-26 MED ORDER — GABAPENTIN 100 MG PO CAPS
ORAL_CAPSULE | ORAL | Status: AC
  Filled 2018-09-26: qty 1

## 2018-09-26 MED ORDER — SUCCINYLCHOLINE CHLORIDE 200 MG/10ML IV SOSY
PREFILLED_SYRINGE | INTRAVENOUS | Status: AC
  Filled 2018-09-26: qty 10

## 2018-09-26 MED ORDER — KETAMINE HCL 10 MG/ML IJ SOLN
INTRAMUSCULAR | Status: DC | PRN
  Administered 2018-09-26: 40 mg via INTRAVENOUS

## 2018-09-26 MED ORDER — SCOPOLAMINE 1 MG/3DAYS TD PT72: 1.0000 | MEDICATED_PATCH | Freq: Once | TRANSDERMAL | Status: DC

## 2018-09-26 MED ORDER — CEFAZOLIN SODIUM-DEXTROSE 2-4 GM/100ML-% IV SOLN
INTRAVENOUS | Status: AC
  Filled 2018-09-26: qty 100

## 2018-09-26 MED ORDER — PROPOFOL 500 MG/50ML IV EMUL
INTRAVENOUS | Status: DC | PRN
  Administered 2018-09-26: 25 ug/kg/min via INTRAVENOUS

## 2018-09-26 MED ORDER — HYDROMORPHONE HCL 1 MG/ML IJ SOLN
INTRAMUSCULAR | Status: AC
  Filled 2018-09-26: qty 0.5

## 2018-09-26 MED ORDER — BUPIVACAINE HCL (PF) 0.25 % IJ SOLN
INTRAMUSCULAR | Status: AC
  Filled 2018-09-26: qty 30

## 2018-09-26 MED ORDER — GABAPENTIN 100 MG PO CAPS
100.0000 mg | ORAL_CAPSULE | Freq: Once | ORAL | Status: AC
  Administered 2018-09-26: 100 mg via ORAL

## 2018-09-26 MED ORDER — DEXAMETHASONE SODIUM PHOSPHATE 10 MG/ML IJ SOLN
INTRAMUSCULAR | Status: AC
  Filled 2018-09-26: qty 2

## 2018-09-26 MED ORDER — HYDROMORPHONE HCL 1 MG/ML IJ SOLN
0.2500 mg | INTRAMUSCULAR | Status: DC | PRN
  Administered 2018-09-26 (×2): 0.5 mg via INTRAVENOUS

## 2018-09-26 MED ORDER — SODIUM CHLORIDE (PF) 0.9 % IJ SOLN
INTRAMUSCULAR | Status: AC
  Filled 2018-09-26: qty 20

## 2018-09-26 MED ORDER — SODIUM CHLORIDE (PF) 0.9 % IJ SOLN
INTRAMUSCULAR | Status: DC | PRN
  Administered 2018-09-26: 20 mL via INTRAVENOUS
  Administered 2018-09-26: 70 mL via INTRAVENOUS

## 2018-09-26 MED ORDER — MIDAZOLAM HCL 2 MG/2ML IJ SOLN
INTRAMUSCULAR | Status: AC
  Filled 2018-09-26: qty 2

## 2018-09-26 MED ORDER — PROPOFOL 500 MG/50ML IV EMUL
INTRAVENOUS | Status: AC
  Filled 2018-09-26: qty 250

## 2018-09-26 MED ORDER — ROCURONIUM BROMIDE 10 MG/ML (PF) SYRINGE
PREFILLED_SYRINGE | INTRAVENOUS | Status: AC
  Filled 2018-09-26: qty 10

## 2018-09-26 MED ORDER — BUPIVACAINE-EPINEPHRINE (PF) 0.5% -1:200000 IJ SOLN
INTRAMUSCULAR | Status: AC
  Filled 2018-09-26: qty 30

## 2018-09-26 MED ORDER — SUGAMMADEX SODIUM 200 MG/2ML IV SOLN
INTRAVENOUS | Status: DC | PRN
  Administered 2018-09-26: 200 mg via INTRAVENOUS

## 2018-09-26 MED ORDER — BUPIVACAINE-EPINEPHRINE 0.5% -1:200000 IJ SOLN
INTRAMUSCULAR | Status: DC | PRN
  Administered 2018-09-26: 30 mL

## 2018-09-26 SURGICAL SUPPLY — 60 items
BAG DECANTER FOR FLEXI CONT (MISCELLANEOUS) ×3 IMPLANT
BLADE KNIFE PERSONA 10 (BLADE) ×12 IMPLANT
BLADE KNIFE PERSONA 15 (BLADE) ×9 IMPLANT
BNDG GAUZE ELAST 4 BULKY (GAUZE/BANDAGES/DRESSINGS) ×6 IMPLANT
CANISTER SUCT 1200ML W/VALVE (MISCELLANEOUS) ×3 IMPLANT
CAP BOUFFANT 24 BLUE NURSES (PROTECTIVE WEAR) ×3 IMPLANT
CLOSURE STERI-STRIP 1/2X4 (GAUZE/BANDAGES/DRESSINGS) ×1
CLOSURE WOUND 1/2 X4 (GAUZE/BANDAGES/DRESSINGS) ×4
CLSR STERI-STRIP ANTIMIC 1/2X4 (GAUZE/BANDAGES/DRESSINGS) ×2 IMPLANT
COVER BACK TABLE REUSABLE LG (DRAPES) ×3 IMPLANT
COVER MAYO STAND REUSABLE (DRAPES) ×3 IMPLANT
COVER WAND RF STERILE (DRAPES) IMPLANT
DECANTER SPIKE VIAL GLASS SM (MISCELLANEOUS) ×6 IMPLANT
DRAIN CHANNEL 10F 3/8 F FF (DRAIN) ×6 IMPLANT
DRAPE U-SHAPE 76X120 STRL (DRAPES) ×6 IMPLANT
DRSG EMULSION OIL 3X3 NADH (GAUZE/BANDAGES/DRESSINGS) ×6 IMPLANT
DRSG PAD ABDOMINAL 8X10 ST (GAUZE/BANDAGES/DRESSINGS) ×3 IMPLANT
ELECT REM PT RETURN 9FT ADLT (ELECTROSURGICAL) ×3
ELECTRODE REM PT RTRN 9FT ADLT (ELECTROSURGICAL) ×1 IMPLANT
EVACUATOR SILICONE 100CC (DRAIN) ×6 IMPLANT
FILTER 7/8 IN (FILTER) IMPLANT
GAUZE SPONGE 4X4 12PLY STRL (GAUZE/BANDAGES/DRESSINGS) ×3 IMPLANT
GLOVE BIO SURGEON STRL SZ7 (GLOVE) ×3 IMPLANT
GOWN STRL REUS W/ TWL LRG LVL3 (GOWN DISPOSABLE) ×2 IMPLANT
GOWN STRL REUS W/TWL LRG LVL3 (GOWN DISPOSABLE) ×4
NDL SAFETY ECLIPSE 18X1.5 (NEEDLE) ×1 IMPLANT
NEEDLE HYPO 18GX1.5 SHARP (NEEDLE) ×2
NEEDLE HYPO 25X1 1.5 SAFETY (NEEDLE) ×9 IMPLANT
NEEDLE SPNL 18GX3.5 QUINCKE PK (NEEDLE) ×3 IMPLANT
NS IRRIG 1000ML POUR BTL (IV SOLUTION) ×6 IMPLANT
PACK BASIN DAY SURGERY FS (CUSTOM PROCEDURE TRAY) ×3 IMPLANT
PIN SAFETY STERILE (MISCELLANEOUS) ×3 IMPLANT
SCRUB TECHNI CARE 4 OZ NO DYE (MISCELLANEOUS) ×3 IMPLANT
SLEEVE SCD COMPRESS KNEE MED (MISCELLANEOUS) ×3 IMPLANT
SPECIMEN JAR MEDIUM (MISCELLANEOUS) IMPLANT
SPECIMEN JAR X LARGE (MISCELLANEOUS) ×6 IMPLANT
SPONGE LAP 18X18 RF (DISPOSABLE) ×9 IMPLANT
STAPLER VISISTAT 35W (STAPLE) ×3 IMPLANT
STRIP CLOSURE SKIN 1/2X4 (GAUZE/BANDAGES/DRESSINGS) ×8 IMPLANT
SUT ETHILON 3 0 PS 1 (SUTURE) ×3 IMPLANT
SUT MNCRL AB 3-0 PS2 18 (SUTURE) ×12 IMPLANT
SUT MNCRL AB 4-0 PS2 18 (SUTURE) ×6 IMPLANT
SUT MON AB 5-0 PS2 18 (SUTURE) ×9 IMPLANT
SUT PROLENE 2 0 CT2 30 (SUTURE) ×3 IMPLANT
SUT PROLENE 3 0 PS 1 (SUTURE) ×6 IMPLANT
SUT VLOC 90 P-14 23 (SUTURE) ×6 IMPLANT
SYR 20CC LL (SYRINGE) ×3 IMPLANT
SYR BULB IRRIGATION 50ML (SYRINGE) ×6 IMPLANT
SYR CONTROL 10ML LL (SYRINGE) ×6 IMPLANT
TAPE MEASURE VINYL STERILE (MISCELLANEOUS) ×3 IMPLANT
TOWEL GREEN STERILE FF (TOWEL DISPOSABLE) ×9 IMPLANT
TRAY DSU PREP LF (CUSTOM PROCEDURE TRAY) ×3 IMPLANT
TRAY FOL W/BAG SLVR 16FR STRL (SET/KITS/TRAYS/PACK) IMPLANT
TRAY FOLEY W/BAG SLVR 14FR LF (SET/KITS/TRAYS/PACK) ×3 IMPLANT
TRAY FOLEY W/BAG SLVR 16FR LF (SET/KITS/TRAYS/PACK)
TUBE CONNECTING 20'X1/4 (TUBING) ×1
TUBE CONNECTING 20X1/4 (TUBING) ×2 IMPLANT
UNDERPAD 30X30 (UNDERPADS AND DIAPERS) ×6 IMPLANT
VAC PENCILS W/TUBING CLEAR (MISCELLANEOUS) ×3 IMPLANT
YANKAUER SUCT BULB TIP NO VENT (SUCTIONS) ×3 IMPLANT

## 2018-09-26 NOTE — H&P (Signed)
  H&P faxed to surgical center.  -History and Physical Reviewed  -Patient has been re-examined  -No change in the plan of care  Thanh Mottern A    

## 2018-09-26 NOTE — Discharge Instructions (Signed)
1. No lifting greater than 5 lbs with arms for 4 weeks. 2. Empty, strip, record and reactivate JP drains 3 times a day. 3. Percocet 5/325 mg tabs 1-2 tabs po q 4-6 hours prn pain- prescription given in office. 4. Duricef 1 tab po bid- prescription given in office. 5. Sterapred dose pack as directed- prescription given in office. 6. Follow-up appointment Friday in office.     Post Anesthesia Home Care Instructions  Activity: Get plenty of rest for the remainder of the day. A responsible individual must stay with you for 24 hours following the procedure.  For the next 24 hours, DO NOT: -Drive a car -Paediatric nurse -Drink alcoholic beverages -Take any medication unless instructed by your physician -Make any legal decisions or sign important papers.  Meals: Start with liquid foods such as gelatin or soup. Progress to regular foods as tolerated. Avoid greasy, spicy, heavy foods. If nausea and/or vomiting occur, drink only clear liquids until the nausea and/or vomiting subsides. Call your physician if vomiting continues.  Special Instructions/Symptoms: Your throat may feel dry or sore from the anesthesia or the breathing tube placed in your throat during surgery. If this causes discomfort, gargle with warm salt water. The discomfort should disappear within 24 hours.  If you had a scopolamine patch placed behind your ear for the management of post- operative nausea and/or vomiting:  1. The medication in the patch is effective for 72 hours, after which it should be removed.  Wrap patch in a tissue and discard in the trash. Wash hands thoroughly with soap and water. 2. You may remove the patch earlier than 72 hours if you experience unpleasant side effects which may include dry mouth, dizziness or visual disturbances. 3. Avoid touching the patch. Wash your hands with soap and water after contact with the patch.         JP Drain [employer] this sheet to all of your post-operative  appointments while you have your drains.  Please measure your drains by CC's or ML's.  Make sure you drain and measure your JP Drains 2 or 3 times per day.  At the end of each day, add up totals for the left side and add up totals for the right side.    ( 9 am )     ( 3 pm )        ( 9 pm )                Date L  R  L  R  L  R  Total L/R  About my Jackson-Pratt Bulb Drain  What is a Jackson-Pratt bulb? A Jackson-Pratt is a soft, round device used to collect drainage. It is connected to a long, thin drainage catheter, which is held in place by one or two small stiches near your surgical incision site. When the bulb is squeezed, it forms a vacuum, forcing the drainage to empty into the bulb.  Emptying the Jackson-Pratt bulb- To empty the bulb: 1. Release the plug on the top of the bulb. 2. Pour the bulb's contents into a measuring container which your nurse will provide. 3. Record the time emptied and amount of drainage. Empty the drain(s) as often as your     doctor or nurse recommends.  Date                  Time                    Amount (Drain 1)                 Amount (Drain 2)  _____________________________________________________________________  _____________________________________________________________________  _____________________________________________________________________  _____________________________________________________________________  _____________________________________________________________________  _____________________________________________________________________  _____________________________________________________________________  _____________________________________________________________________  Squeezing the Jackson-Pratt Bulb- To squeeze  the bulb: 1. Make sure the plug at the top of the bulb is open. 2. Squeeze the bulb tightly in your fist. You will hear air squeezing from the bulb. 3. Replace the plug while the bulb is squeezed. 4. Use a safety pin to attach the bulb to your clothing. This will keep the catheter from     pulling at the bulb insertion site.  When to call your doctor- Call your doctor if:  Drain site becomes red, swollen or hot.  You have a fever greater than 101 degrees F.  There is oozing at the drain site.  Drain falls out (apply a guaze bandage over the drain hole and secure it with tape).  Drainage increases daily not related to activity patterns. (You will usually have more drainage when you are active than when you are resting.)  Drainage has a bad odor.

## 2018-09-26 NOTE — Anesthesia Postprocedure Evaluation (Signed)
Anesthesia Post Note  Patient: Rogina Insurance claims handler  Procedure(s) Performed: BILATERAL MAMMARY REDUCTION  (BREAST) (Bilateral Breast)     Patient location during evaluation: [facility] Recovery Anesthesia Type: General Level of consciousness: awake and alert Pain management: pain level controlled Vital Signs Assessment: post-procedure vital signs reviewed and stable Respiratory status: spontaneous breathing, nonlabored ventilation, respiratory function stable and patient connected to nasal cannula oxygen Cardiovascular status: blood pressure returned to baseline and stable Postop Assessment: no apparent nausea or vomiting Anesthetic complications: no    Last Vitals:  Vitals:   09/26/18 1604 09/26/18 1625  BP: 119/74 (!) 147/83  Pulse: 72 (!) 57  Resp: 15 18  Temp:  36.8 C  SpO2: 100% 100%    Last Pain:  Vitals:   09/26/18 1625  TempSrc:   PainSc: 0-No pain                 Barnet Glasgow

## 2018-09-26 NOTE — Op Note (Signed)
OPERATIVE REPORT  09/26/2018  Kristin Martinez  PREOPERATIVE DIAGNOSIS:  Bilateral macromastia.  POSTOPERATIVE DIAGNOSIS:  Bilateral macromastia.  PROCEDURE:  Bilateral reduction mammoplasties.  ATTENDING SURGEON:  Youlanda Roys, MD  ANESTHESIA:  General.  ANESTHESIOLOGIST:  , MD  COMPLICATIONS:  None.  INDICATIONS FOR THE PROCEDURE:  The patient is a 43 y.o. female who has bilateral macromastia that is clinically symptomatic.  She presents to undergo bilateral reduction mammoplasties.  DESCRIPTION OF PROCEDURE:  The patient was marked in preop holding area in a pattern of Wise for the future bilateral reduction mammoplasties. She was then taken back to the OR, placed on the table in supine position.  After adequate general anesthesia was obtained, the patient's chest was prepped with Techni-Care and draped in sterile fashion.  The bases of the breasts have been infiltrated with 1% lidocaine with epinephrine.  After adequate hemostasis and anesthesia taken effect, the procedure was begun.  Both of the breast reductions were performed in the following similar manner.  The nipple-areolar complex was marked with a 45-mm nipple marker.  The skin was then incised and deepithelialized around the nipple-areolar complex down to the inframammary crease in the inferior pedicle pattern.  Next, the medial, superior, and lateral skin flaps were elevated down to the chest wall.  Excess fat and glandular tissue removed from the inferior pedicle.  The nipple-areolar complex was examined and found to be pink and viable.  The wound was irrigated with saline irrigation.  Meticulous hemostasis was obtained with the Bovie electrocautery.  Inferior pedicle was centralized using 3-0 Prolene suture.  A #10 JP flat fully fluted drain was placed into the wound. The skin flaps were brought together at the inverted T junction with a 2- 0 Prolene suture.  The incisions were stapled  for temporary closure. The breasts compared and found to have good shape and symmetry.  The incisions were then closed from the medial aspect of the JP drain to the medial aspect of the [facility] incision by first placing a few 3-0 Monocryl sutures to tack together the dermal layer, and then both the dermal and cuticular layer were closed in a single layer using a 3-0 V-Lock barbed suture.  Lateral to the JP drain incision was closed using 3-0 Monocryl in the dermal layer, followed by 3-0 Monocryl running intracuticular stitch on the skin.  The vertical limb of the Wise pattern was closed in the dermal layer using 3-0 Monocryl suture.  The patient was placed in the upright position.  The future location of the nipple-areolar complexes was marked on both breast mounds using the 45-mm nipple marker.  She was then placed back in the recumbent position.  Both of the nipple areolar complexes were brought out onto the breast mounds in the following similar manner.  The skin was incised as marked and removed in full thickness into the subcutaneous tissues.  The nipple- areolar complex was examined, found to be pink and viable, then brought out through this aperture and sewn in place using 4-0 Monocryl in the dermal layer, followed by 5-0 Monocryl running intracuticular stitch on the skin.  This 5-0 Monocryl suture was then brought down to close the cuticular layer of the vertical limb as well.  The JP drain was sewn in place using 3-0 nylon suture.  The pectoralis major muscle and fascia along with the breast and chest soft tissues were then infiltrated with 1% Exparel (total 266 mg).  Now the [facility] incision was also infiltrated with  the Exparel in order to give the patient postoperative pain control.  The incisions were dressed with benzoin, Steri-Strips, and the nipples dressed with bacitracin ointment and Adaptic.  4x4s were placed over the incisions and ABD pads in the axillary areas.  The patient  was placed into a light postoperative support bra.  There were no complications. The patient tolerated the procedure well.  The final needle, sponge counts were reported to be correct at the end of the case.  The patient was then recovered without complications.  Both the patient and her family were given proper postoperative wound care instructions. She was then discharged home in the care of her family in stable condition.  Follow up will be with me in a few days in the office.         Youlanda Roys, M.D.  09/26/2018 2:16 PM

## 2018-09-26 NOTE — Anesthesia Procedure Notes (Signed)
Procedure Name: Intubation Date/Time: 09/26/2018 8:43 AM Performed by: Signe Colt, CRNA Pre-anesthesia Checklist: Patient identified, Emergency Drugs available, Suction available and Patient being monitored Patient Re-evaluated:Patient Re-evaluated prior to induction Oxygen Delivery Method: Circle system utilized Preoxygenation: Pre-oxygenation with 100% oxygen Induction Type: IV induction Ventilation: Mask ventilation without difficulty Laryngoscope Size: Mac and 3 Grade View: Grade I Tube type: Oral Tube size: 7.0 mm Number of attempts: 1 Airway Equipment and Method: Stylet and Oral airway Placement Confirmation: ETT inserted through vocal cords under direct vision,  positive ETCO2 and breath sounds checked- equal and bilateral Secured at: 21 cm Tube secured with: Tape Dental Injury: Teeth and Oropharynx as per pre-operative assessment

## 2018-09-26 NOTE — Brief Op Note (Signed)
09/26/2018  2:14 PM  PATIENT:  Kristin Martinez  43 y.o. female  PRE-OPERATIVE DIAGNOSIS:  BILATERAL MACROMASTIA  POST-OPERATIVE DIAGNOSIS:  BILATERAL MACROMASTIA  PROCEDURE:  Procedure(s) with comments: BILATERAL MAMMARY REDUCTION  (BREAST) (Bilateral) - left breast reduction - 1605 grams right breast reduction - 1947 grams  SURGEON:  Surgeon(s) and Role:    * Contogiannis, Audrea Muscat, MD - Primary  ANESTHESIA:   general  EBL:  175 mL   BLOOD ADMINISTERED:none  DRAINS: (62F) Jackson-Pratt drain(s) with closed bulb suction in the Bilateral breasts   LOCAL MEDICATIONS USED:  1.3% Exparel (total 266 mgs.)  SPECIMEN:  Source of Specimen:  Bilateral Breasts  DISPOSITION OF SPECIMEN:  PATHOLOGY  COUNTS:  YES  DICTATION: .Note written in EPIC  PLAN OF CARE: Discharge to home after PACU  PATIENT DISPOSITION:  PACU - hemodynamically stable.   Delay start of Pharmacological VTE agent (>24hrs) due to surgical blood loss or risk of bleeding: not applicable

## 2018-09-26 NOTE — Transfer of Care (Signed)
Immediate Anesthesia Transfer of Care Note  Patient: Kristin Martinez  Procedure(s) Performed: BILATERAL MAMMARY REDUCTION  (BREAST) (Bilateral Breast)  Patient Location: PACU  Anesthesia Type:General  Level of Consciousness: drowsy and patient cooperative  Airway & Oxygen Therapy: Patient Spontanous Breathing and Patient connected to face mask oxygen  Post-op Assessment: Report given to RN and Post -op Vital signs reviewed and stable  Post vital signs: Reviewed and stable  Last Vitals:  Vitals Value Taken Time  BP    Temp    Pulse 69 09/26/18 1431  Resp    SpO2 100 % 09/26/18 1431  Vitals shown include unvalidated device data.  Last Pain:  Vitals:   09/26/18 0505  TempSrc: Oral  PainSc: 2          Complications: No apparent anesthesia complications

## 2018-09-27 ENCOUNTER — Encounter (HOSPITAL_BASED_OUTPATIENT_CLINIC_OR_DEPARTMENT_OTHER): Payer: Self-pay | Admitting: Plastic Surgery

## 2019-05-30 ENCOUNTER — Other Ambulatory Visit: Payer: Self-pay | Admitting: Obstetrics and Gynecology

## 2019-05-30 DIAGNOSIS — Z1231 Encounter for screening mammogram for malignant neoplasm of breast: Secondary | ICD-10-CM

## 2019-06-24 ENCOUNTER — Ambulatory Visit
Admission: RE | Admit: 2019-06-24 | Discharge: 2019-06-24 | Disposition: A | Discharge status: Home / Self Care | Payer: BC Managed Care – PPO | Source: Ambulatory Visit | Attending: Obstetrics and Gynecology | Admitting: Obstetrics and Gynecology

## 2019-06-24 DIAGNOSIS — Z1231 Encounter for screening mammogram for malignant neoplasm of breast: Secondary | ICD-10-CM | POA: Diagnosis not present

## 2020-06-09 ENCOUNTER — Other Ambulatory Visit: Payer: Self-pay | Admitting: Obstetrics and Gynecology

## 2020-06-09 DIAGNOSIS — Z1231 Encounter for screening mammogram for malignant neoplasm of breast: Secondary | ICD-10-CM

## 2020-06-30 ENCOUNTER — Other Ambulatory Visit: Payer: Self-pay

## 2020-06-30 ENCOUNTER — Ambulatory Visit
Admission: RE | Admit: 2020-06-30 | Discharge: 2020-06-30 | Disposition: A | Discharge status: Home / Self Care | Payer: BC Managed Care – PPO | Source: Ambulatory Visit | Attending: Obstetrics and Gynecology | Admitting: Obstetrics and Gynecology

## 2020-06-30 DIAGNOSIS — Z1231 Encounter for screening mammogram for malignant neoplasm of breast: Secondary | ICD-10-CM | POA: Diagnosis not present

## 2020-10-30 ENCOUNTER — Ambulatory Visit: Payer: BC Managed Care – PPO | Admitting: Anesthesiology

## 2020-10-30 ENCOUNTER — Encounter
Admission: RE | Disposition: A | Discharge status: Home / Self Care | Payer: Self-pay | Source: Ambulatory Visit | Attending: Gastroenterology

## 2020-10-30 ENCOUNTER — Ambulatory Visit
Admission: RE | Admit: 2020-10-30 | Discharge: 2020-10-30 | Disposition: A | Discharge status: Home / Self Care | Payer: BC Managed Care – PPO | Source: Ambulatory Visit | Attending: Gastroenterology | Admitting: Gastroenterology

## 2020-10-30 DIAGNOSIS — Z79890 Hormone replacement therapy (postmenopausal): Secondary | ICD-10-CM | POA: Diagnosis not present

## 2020-10-30 DIAGNOSIS — Z882 Allergy status to sulfonamides status: Secondary | ICD-10-CM | POA: Insufficient documentation

## 2020-10-30 DIAGNOSIS — D123 Benign neoplasm of transverse colon: Secondary | ICD-10-CM | POA: Insufficient documentation

## 2020-10-30 DIAGNOSIS — Z79899 Other long term (current) drug therapy: Secondary | ICD-10-CM | POA: Insufficient documentation

## 2020-10-30 DIAGNOSIS — Z1211 Encounter for screening for malignant neoplasm of colon: Secondary | ICD-10-CM | POA: Insufficient documentation

## 2020-10-30 DIAGNOSIS — Z7984 Long term (current) use of oral hypoglycemic drugs: Secondary | ICD-10-CM | POA: Insufficient documentation

## 2020-10-30 DIAGNOSIS — Z888 Allergy status to other drugs, medicaments and biological substances status: Secondary | ICD-10-CM | POA: Diagnosis not present

## 2020-10-30 DIAGNOSIS — E119 Type 2 diabetes mellitus without complications: Secondary | ICD-10-CM | POA: Diagnosis not present

## 2020-10-30 DIAGNOSIS — Z91018 Allergy to other foods: Secondary | ICD-10-CM | POA: Insufficient documentation

## 2020-10-30 DIAGNOSIS — K640 First degree hemorrhoids: Secondary | ICD-10-CM | POA: Insufficient documentation

## 2020-10-30 DIAGNOSIS — Z791 Long term (current) use of non-steroidal anti-inflammatories (NSAID): Secondary | ICD-10-CM | POA: Insufficient documentation

## 2020-10-30 DIAGNOSIS — Z6838 Body mass index (BMI) 38.0-38.9, adult: Secondary | ICD-10-CM | POA: Diagnosis not present

## 2020-10-30 DIAGNOSIS — D122 Benign neoplasm of ascending colon: Secondary | ICD-10-CM | POA: Diagnosis not present

## 2020-10-30 DIAGNOSIS — E669 Obesity, unspecified: Secondary | ICD-10-CM | POA: Diagnosis not present

## 2020-10-30 HISTORY — DX: Anxiety disorder, unspecified: F41.9

## 2020-10-30 HISTORY — DX: Cardiac murmur, unspecified: R01.1

## 2020-10-30 HISTORY — DX: Other specified abnormal immunological findings in serum: R76.8

## 2020-10-30 HISTORY — DX: Unspecified iridocyclitis: H20.9

## 2020-10-30 HISTORY — DX: Gastro-esophageal reflux disease without esophagitis: K21.9

## 2020-10-30 HISTORY — DX: Hyperlipidemia, unspecified: E78.5

## 2020-10-30 HISTORY — PX: COLONOSCOPY WITH PROPOFOL: SHX5780

## 2020-10-30 HISTORY — DX: Sleep apnea, unspecified: G47.30

## 2020-10-30 LAB — POCT PREGNANCY, URINE: Preg Test, Ur: NEGATIVE

## 2020-10-30 SURGERY — COLONOSCOPY WITH PROPOFOL
Anesthesia: General

## 2020-10-30 MED ORDER — LIDOCAINE HCL (PF) 2 % IJ SOLN
INTRAMUSCULAR | Status: AC
  Filled 2020-10-30: qty 5

## 2020-10-30 MED ORDER — PROPOFOL 500 MG/50ML IV EMUL
INTRAVENOUS | Status: DC | PRN
  Administered 2020-10-30: 125 ug/kg/min via INTRAVENOUS

## 2020-10-30 MED ORDER — PROPOFOL 10 MG/ML IV BOLUS
INTRAVENOUS | Status: DC | PRN
  Administered 2020-10-30: 75 mg via INTRAVENOUS
  Administered 2020-10-30: 25 mg via INTRAVENOUS
  Administered 2020-10-30: 30 mg via INTRAVENOUS

## 2020-10-30 MED ORDER — LIDOCAINE HCL (CARDIAC) PF 100 MG/5ML IV SOSY
PREFILLED_SYRINGE | INTRAVENOUS | Status: DC | PRN
  Administered 2020-10-30: 30 mg via INTRAVENOUS

## 2020-10-30 MED ORDER — SODIUM CHLORIDE 0.9 % IV SOLN: INTRAVENOUS | Status: DC

## 2020-10-30 MED ORDER — PROPOFOL 500 MG/50ML IV EMUL
INTRAVENOUS | Status: AC
  Filled 2020-10-30: qty 50

## 2020-10-30 NOTE — Anesthesia Preprocedure Evaluation (Signed)
Anesthesia Evaluation  Patient identified by MRN, date of birth, ID band Patient awake    Reviewed: Allergy & Precautions, NPO status , Patient's Chart, lab work & pertinent test results  History of Anesthesia Complications Negative for: history of anesthetic complications  Airway Mallampati: III  TM Distance: >3 FB Neck ROM: Full    Dental no notable dental hx. (+) Teeth Intact   Pulmonary sleep apnea and Continuous Positive Airway Pressure Ventilation , neg COPD, Patient abstained from smoking.Not current smoker,    Pulmonary exam normal breath sounds clear to auscultation       Cardiovascular Exercise Tolerance: Good METShypertension, (-) CAD and (-) Past MI (-) dysrhythmias  Rhythm:Regular Rate:Normal - Systolic murmurs    Neuro/Psych  Headaches, negative psych ROS   GI/Hepatic GERD  Controlled and Medicated,(+)     (-) substance abuse  ,   Endo/Other  neg diabetesHypothyroidism   Renal/GU negative Renal ROS     Musculoskeletal   Abdominal (+) + obese,   Peds  Hematology   Anesthesia Other Findings Past Medical History: No date: ANA positive No date: Arthritis No date: GERD (gastroesophageal reflux disease) No date: Heart murmur No date: Hyperlipidemia No date: Hypertension No date: Hypothyroid No date: Iritis No date: Migraine No date: PCOS (polycystic ovarian syndrome) No date: Sleep apnea  Reproductive/Obstetrics                             Anesthesia Physical Anesthesia Plan  ASA: 2  Anesthesia Plan: General   Post-op Pain Management:    Induction: Intravenous  PONV Risk Score and Plan: 3 and Ondansetron, Propofol infusion and TIVA  Airway Management Planned: Nasal Cannula  Additional Equipment: None  Intra-op Plan:   Post-operative Plan:   Informed Consent: I have reviewed the patients History and Physical, chart, labs and discussed the procedure including  the risks, benefits and alternatives for the proposed anesthesia with the patient or authorized representative who has indicated his/her understanding and acceptance.     Dental advisory given  Plan Discussed with: CRNA and Surgeon  Anesthesia Plan Comments: (Discussed risks of anesthesia with patient, including possibility of difficulty with spontaneous ventilation under anesthesia necessitating airway intervention, PONV, and rare risks such as cardiac or respiratory or neurological events, and allergic reactions. Patient understands.)        Anesthesia Quick Evaluation

## 2020-10-30 NOTE — Anesthesia Postprocedure Evaluation (Signed)
Anesthesia Post Note  Patient: Kristin Martinez  Procedure(s) Performed: COLONOSCOPY WITH PROPOFOL  Patient location during evaluation: Endoscopy Anesthesia Type: General Level of consciousness: awake and alert Pain management: pain level controlled Vital Signs Assessment: post-procedure vital signs reviewed and stable Respiratory status: spontaneous breathing, nonlabored ventilation, respiratory function stable and patient connected to nasal cannula oxygen Cardiovascular status: blood pressure returned to baseline and stable Postop Assessment: no apparent nausea or vomiting Anesthetic complications: no   No notable events documented.   Last Vitals:  Vitals:   10/30/20 0922 10/30/20 0932  BP: 119/70 122/77  Pulse: 72 (!) 54  Resp: 15 18  Temp:    SpO2: 100% 100%    Last Pain:  Vitals:   10/30/20 0932  TempSrc:   PainSc: 0-No pain                 Arita Miss

## 2020-10-30 NOTE — H&P (Signed)
Outpatient short stay form Pre-procedure 10/30/2020 8:38 AM Raylene Miyamoto MD, MPH  Primary Physician: Dr. Ouida Sills  Reason for visit:  Screening  History of present illness:   45 y/o lady with history of obesity, hypothyroidism, and DM II here for screening colonoscopy. No blood thinners. No family history of GI malignancies. History of c-section.    Current Facility-Administered Medications:    0.9 %  sodium chloride infusion, , Intravenous, Continuous, Jeriah Corkum, Hilton Cork, MD, Last Rate: 20 mL/hr at 10/30/20 0835, Continued from Pre-op at 10/30/20 0835  Medications Prior to Admission  Medication Sig Dispense Refill Last Dose   docusate sodium (COLACE) 100 MG capsule Take 100 mg by mouth 2 (two) times daily.   10/29/2020   levothyroxine (SYNTHROID, LEVOTHROID) 125 MCG tablet Take 62.5 mcg by mouth daily before breakfast.   10/29/2020   pantoprazole (PROTONIX) 20 MG tablet Take 20 mg by mouth daily.   10/29/2020   QUASENSE 0.15-0.03 MG tablet Take 1 tablet by mouth daily.  3 10/29/2020   spironolactone (ALDACTONE) 100 MG tablet Take 100 mg by mouth at bedtime.   Past Week   Cholecalciferol ([employer] VIT D 5000 HIGH-POTENCY) 5000 UNITS capsule Take 5,000 Units by mouth daily.      Chromium 1000 MCG TABS Take by mouth.      cyclobenzaprine (FLEXERIL) 5 MG tablet Take 1 tablet (5 mg total) by mouth 3 (three) times daily as needed for muscle spasms. 15 tablet 0    diclofenac (VOLTAREN) 75 MG EC tablet Take 75 mg by mouth 3 (three) times daily.      EPINEPHrine 0.3 mg/0.3 mL IJ SOAJ injection Inject 0.3 mg into the muscle as needed for anaphylaxis.      glucosamine-chondroitin 500-400 MG tablet Take 1 tablet by mouth daily.      loratadine (CLARITIN) 10 MG tablet Take 10 mg by mouth daily.      metFORMIN (GLUCOPHAGE) 500 MG tablet Take 500 mg by mouth 2 (two) times daily with a meal.      Multiple Vitamin (MULTIVITAMIN WITH MINERALS) TABS tablet Take 1 tablet by mouth daily.      ondansetron (ZOFRAN) 4  MG tablet Take 1 tablet (4 mg total) by mouth every 6 (six) hours. 12 tablet 0    Phendimetrazine Tartrate 35 MG TABS Take by mouth.      SUMAtriptan (IMITREX) 25 MG tablet Take 25 mg by mouth every 2 (two) hours as needed for migraine or headache. May repeat in 2 hours if headache persists or recurs.      Turmeric 400 MG CAPS Take by mouth.      VANIQA 13.9 % cream Apply 1 application topically 2 (two) times daily.  0    vitamin C (ASCORBIC ACID) 500 MG tablet Take 500 mg by mouth daily.      Zinc 30 MG CAPS Take by mouth.        Allergies  Allergen Reactions   Strawberry Extract Anaphylaxis   Sulfa Antibiotics Hives and Swelling   Adhesive [Tape]     Some bandaids cause rash     Past Medical History:  Diagnosis Date   ANA positive    Arthritis    GERD (gastroesophageal reflux disease)    Heart murmur    Hyperlipidemia    Hypertension    Hypothyroid    Iritis    Migraine    PCOS (polycystic ovarian syndrome)    Sleep apnea     Review of systems:  Otherwise negative.  Physical Exam  Gen: Alert, oriented. Appears stated age.  HEENT: PERRLA. Lungs: No respiratory distress CV: RRR Abd: soft, benign, no masses Ext: No edema    Planned procedures: Proceed with colonoscopy. The patient understands the nature of the planned procedure, indications, risks, alternatives and potential complications including but not limited to bleeding, infection, perforation, damage to internal organs and possible oversedation/side effects from anesthesia. The patient agrees and gives consent to proceed.  Please refer to procedure notes for findings, recommendations and patient disposition/instructions.     Raylene Miyamoto MD, MPH Gastroenterology 10/30/2020  8:38 AM

## 2020-10-30 NOTE — Op Note (Signed)
[facility] Gastroenterology Patient Name: Kristin Martinez Procedure Date: 10/30/2020 8:35 AM MRN: [id number] Account #: [id number] Date of Birth: 05/26/1975 Admit Type: Outpatient Age: 45 Room: [facility] ENDO ROOM 3 Gender: Female Note Status: Finalized Procedure:             Colonoscopy Indications:           Screening for colorectal malignant neoplasm Providers:             Andrey Farmer MD, MD Medicines:             Monitored Anesthesia Care Complications:         No immediate complications. Estimated blood loss:                         Minimal. Procedure:             Pre-Anesthesia Assessment:                        - Prior to the procedure, a History and Physical was                         performed, and patient medications and allergies were                         reviewed. The patient is competent. The risks and                         benefits of the procedure and the sedation options and                         risks were discussed with the patient. All questions                         were answered and informed consent was obtained.                         Patient identification and proposed procedure were                         verified by the physician, the nurse, the anesthetist                         and the technician in the endoscopy suite. Mental                         Status Examination: alert and oriented. Airway                         Examination: normal oropharyngeal airway and neck                         mobility. Respiratory Examination: clear to                         auscultation. CV Examination: normal. Prophylactic                         Antibiotics: The patient does not require prophylactic  antibiotics. Prior Anticoagulants: The patient has                         taken no previous anticoagulant or antiplatelet                         agents. ASA Grade Assessment: II - A patient with mild                          systemic disease. After reviewing the risks and                         benefits, the patient was deemed in satisfactory                         condition to undergo the procedure. The anesthesia                         plan was to use monitored anesthesia care (MAC).                         Immediately prior to administration of medications,                         the patient was re-assessed for adequacy to receive                         sedatives. The heart rate, respiratory rate, oxygen                         saturations, blood pressure, adequacy of pulmonary                         ventilation, and response to care were monitored                         throughout the procedure. The physical status of the                         patient was re-assessed after the procedure.                        After obtaining informed consent, the colonoscope was                         passed under direct vision. Throughout the procedure,                         the patient's blood pressure, pulse, and oxygen                         saturations were monitored continuously. The                         Colonoscope was introduced through the anus and                         advanced to the the cecum, identified by appendiceal  orifice and ileocecal valve. The colonoscopy was                         performed without difficulty. The patient tolerated                         the procedure well. The quality of the bowel                         preparation was good. Findings:      The perianal and digital rectal examinations were normal.      Two sessile polyps were found in the transverse colon and ascending       colon. The polyps were 1 to 2 mm in size. These polyps were removed with       a jumbo cold forceps. Resection and retrieval were complete. Estimated       blood loss was minimal.      Internal hemorrhoids were found during retroflexion. The hemorrhoids        were Grade I (internal hemorrhoids that do not prolapse).      The exam was otherwise without abnormality on direct and retroflexion       views. Impression:            - Two 1 to 2 mm polyps in the transverse colon and in                         the ascending colon, removed with a jumbo cold                         forceps. Resected and retrieved.                        - Internal hemorrhoids.                        - The examination was otherwise normal on direct and                         retroflexion views. Recommendation:        - Discharge patient to home.                        - Resume previous diet.                        - Continue present medications.                        - Await pathology results.                        - Repeat colonoscopy for surveillance based on                         pathology results.                        - Return to referring physician as previously                         scheduled. Procedure Code(s):     ---  Professional ---                        [id number], Colonoscopy, flexible; with biopsy, single or                         multiple Diagnosis Code(s):     --- Professional ---                        Z12.11, Encounter for screening for malignant neoplasm                         of colon                        K63.5, Polyp of colon                        K64.0, First degree hemorrhoids CPT copyright 2019 [employer]. All rights reserved. The codes documented in this report are preliminary and upon coder review may  be revised to meet current compliance requirements. Andrey Farmer MD, MD 10/30/2020 9:11:21 AM Number of Addenda: 0 Note Initiated On: 10/30/2020 8:35 AM Scope Withdrawal Time: 0 hours 13 minutes 35 seconds  Total Procedure Duration: 0 hours 18 minutes 58 seconds  Estimated Blood Loss:  Estimated blood loss was minimal.      [facility]

## 2020-10-30 NOTE — Interval H&P Note (Signed)
History and Physical Interval Note:  10/30/2020 8:42 AM  Kristin Martinez  has presented today for surgery, with the diagnosis of screening.  The various methods of treatment have been discussed with the patient and family. After consideration of risks, benefits and other options for treatment, the patient has consented to  Procedure(s) with comments: COLONOSCOPY WITH PROPOFOL (N/A) - DM as a surgical intervention.  The patient's history has been reviewed, patient examined, no change in status, stable for surgery.  I have reviewed the patient's chart and labs.  Questions were answered to the patient's satisfaction.     Lesly Rubenstein  Ok to proceed with colonoscopy

## 2020-10-30 NOTE — Transfer of Care (Signed)
Immediate Anesthesia Transfer of Care Note  Patient: Kristin Martinez  Procedure(s) Performed: COLONOSCOPY WITH PROPOFOL  Patient Location: PACU and Endoscopy Unit  Anesthesia Type:General  Level of Consciousness: awake  Airway & Oxygen Therapy: Patient Spontanous Breathing  Post-op Assessment: Report given to RN  Post vital signs: stable  Last Vitals:  Vitals Value Taken Time  BP 109/53 10/30/20 0912  Temp 36.1 C 10/30/20 0912  Pulse 63 10/30/20 0916  Resp 19 10/30/20 0916  SpO2 98 % 10/30/20 0916  Vitals shown include unvalidated device data.  Last Pain:  Vitals:   10/30/20 0912  TempSrc: Temporal  PainSc: Asleep         Complications: No notable events documented.

## 2020-11-02 ENCOUNTER — Encounter: Payer: Self-pay | Admitting: Gastroenterology

## 2020-11-02 LAB — SURGICAL PATHOLOGY

## 2021-06-14 ENCOUNTER — Other Ambulatory Visit: Payer: Self-pay | Admitting: Obstetrics and Gynecology

## 2021-06-14 DIAGNOSIS — Z1231 Encounter for screening mammogram for malignant neoplasm of breast: Secondary | ICD-10-CM

## 2021-06-28 IMAGING — MG MM DIGITAL SCREENING BILAT W/ TOMO AND CAD
6 of 10 series · 6 of 30 positions shown · non-contrast
Comparison: Previous exam(s).

CLINICAL DATA: Screening.

EXAM:
DIGITAL SCREENING BILATERAL MAMMOGRAM WITH TOMOSYNTHESIS AND CAD
TECHNIQUE: Bilateral screening digital craniocaudal and mediolateral oblique
mammograms were obtained. Bilateral screening digital breast
tomosynthesis was performed. The images were evaluated with
computer-aided detection.

[R MLO synth-2D (1 of 2)]
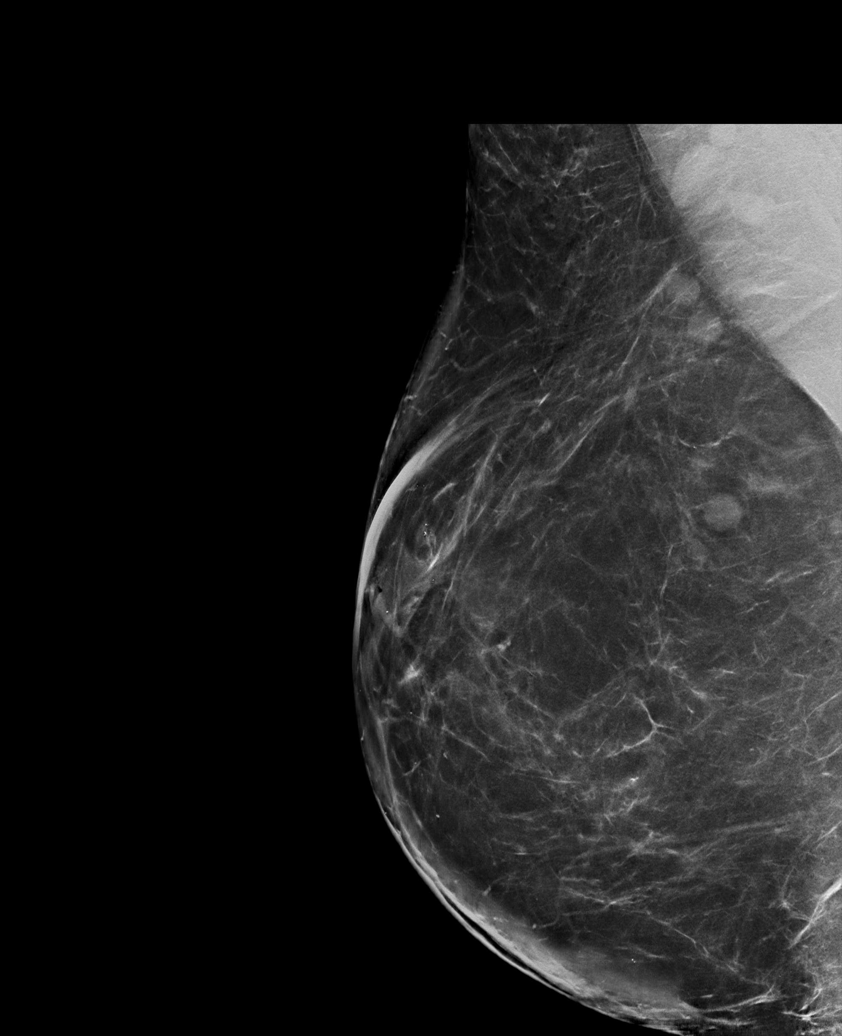

[L MLO synth-2D]
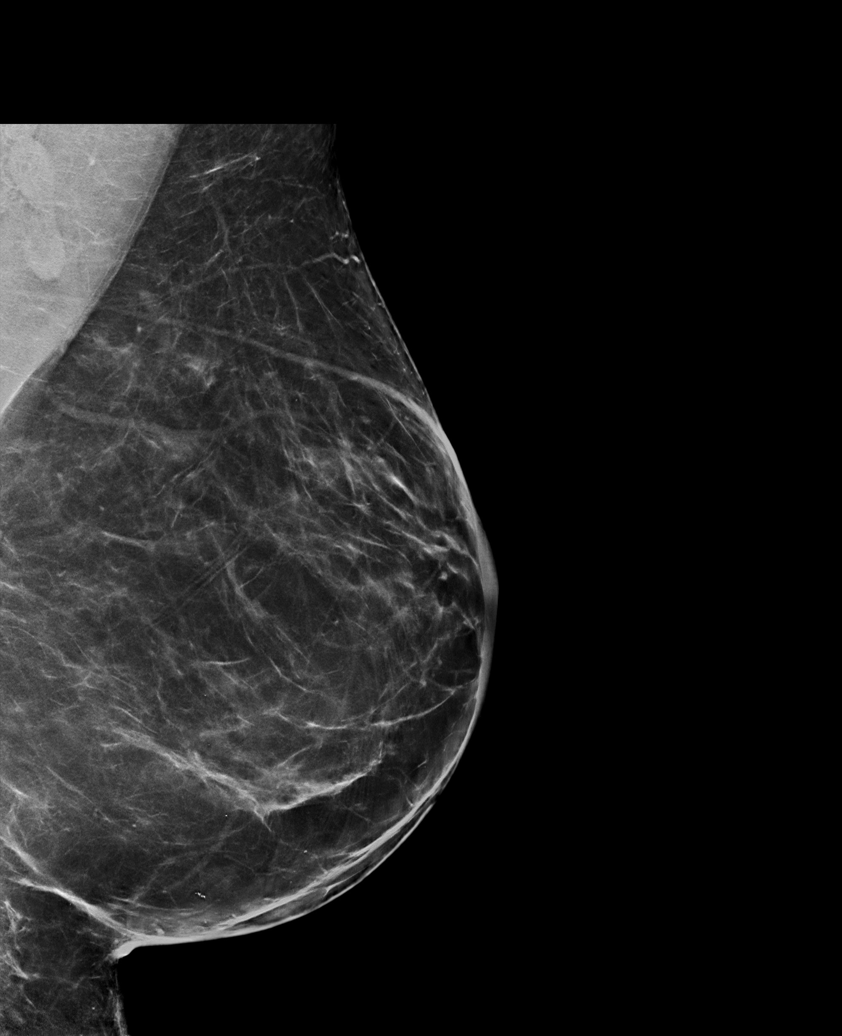

[R CC synth-2D]
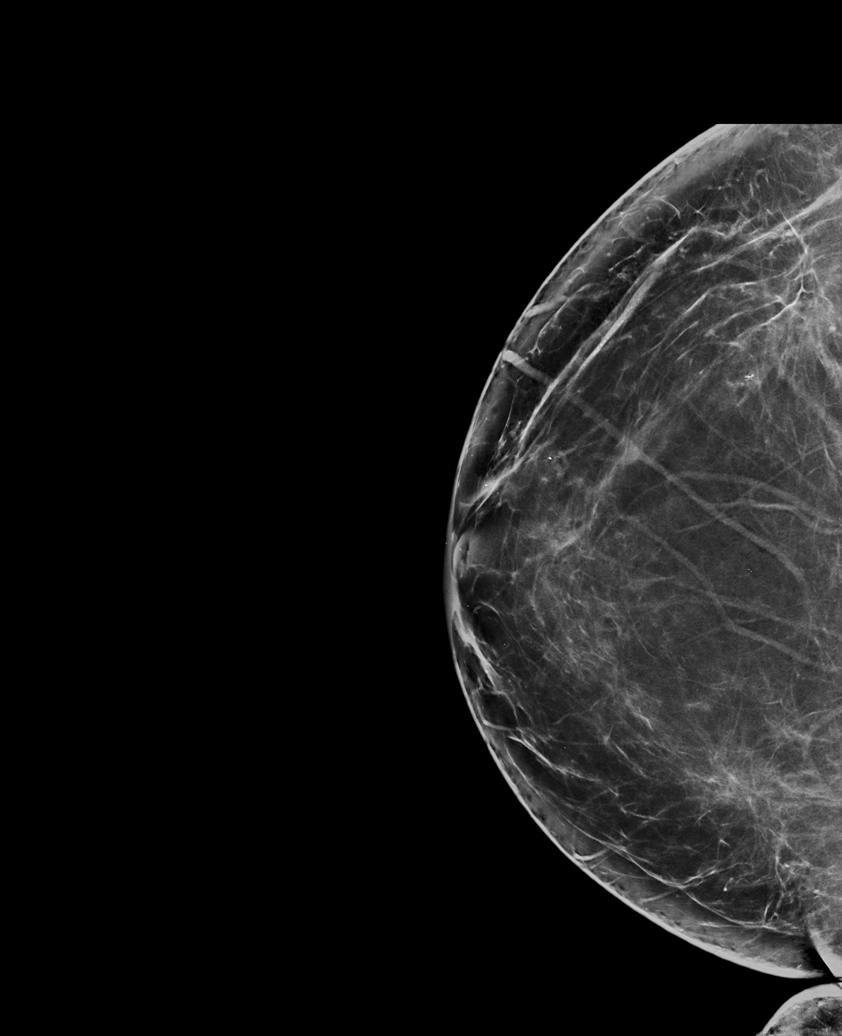

[R MLO synth-2D (2 of 2)]
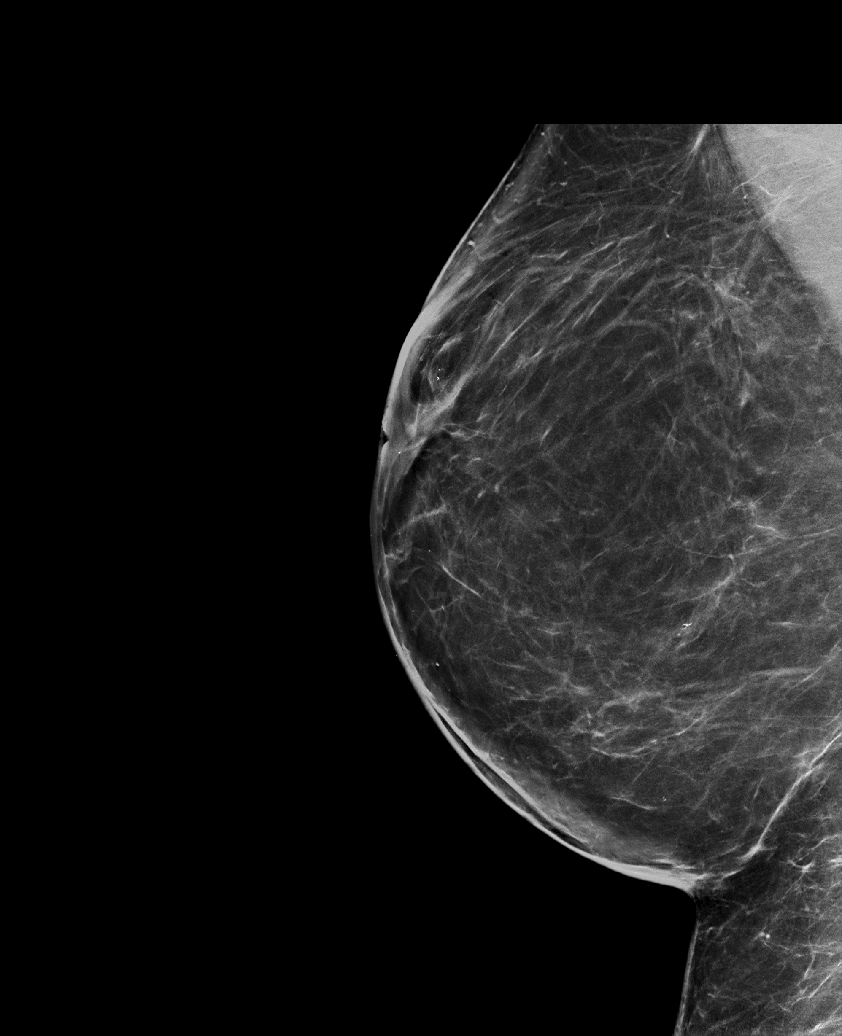

[L CC synth-2D]
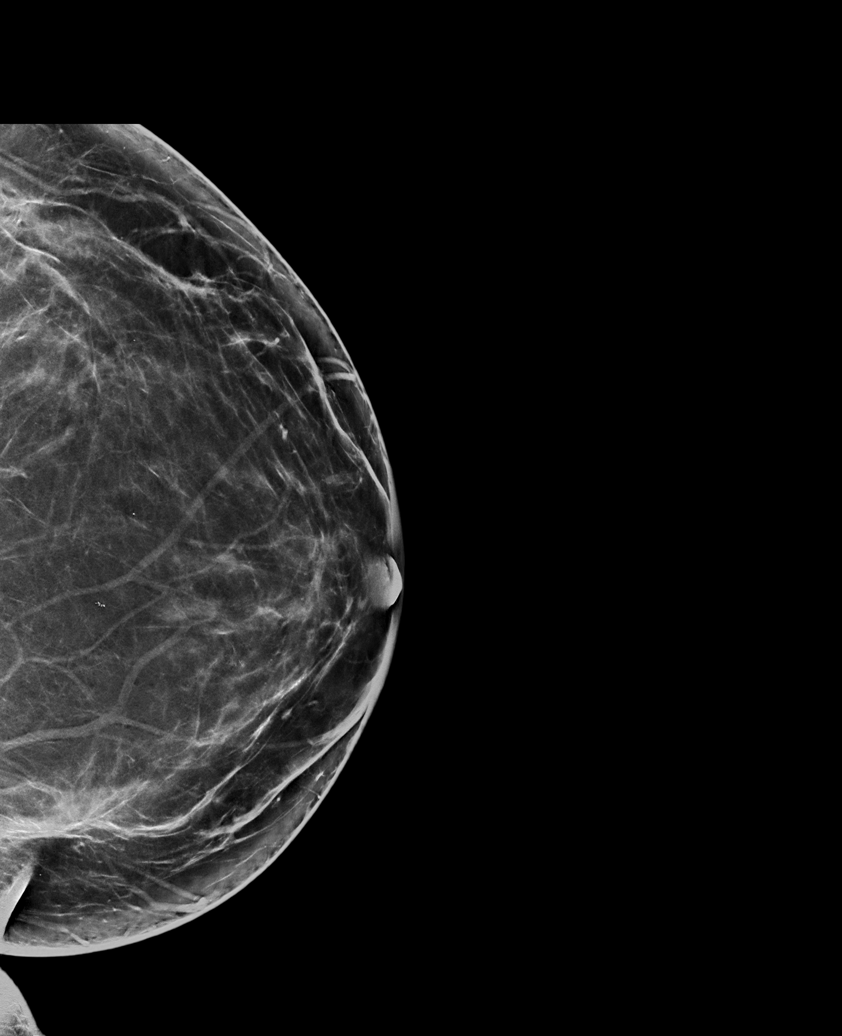

[L CC tomo · tomo slice 45/89.0]
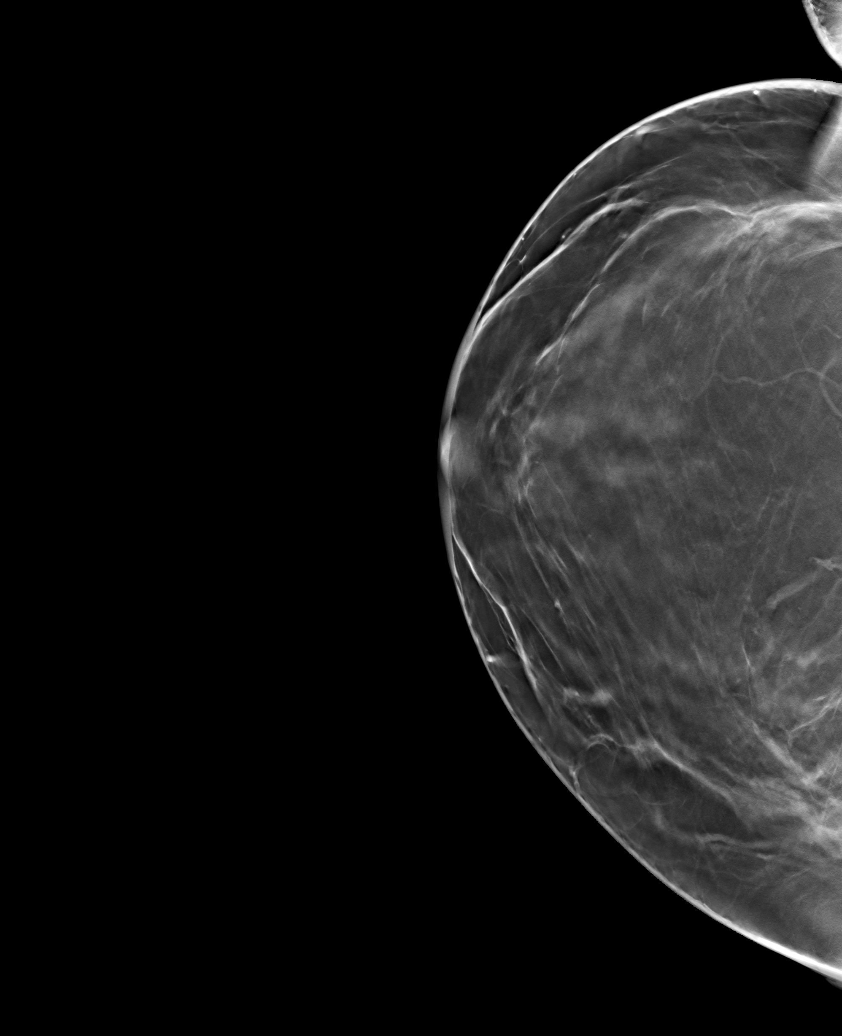

[6 of 30 positions shown; findings below may reference images not displayed]

ACR Breast Density Category b: There are scattered areas of
fibroglandular density.
FINDINGS: There are no findings suspicious for malignancy. The images were
evaluated with computer-aided detection.
IMPRESSION: No mammographic evidence of malignancy. A result letter of this
screening mammogram will be mailed directly to the patient.

RECOMMENDATION:
Screening mammogram in one year. (Code:WJ-I-BG6)

BI-RADS CATEGORY  1: Negative.

## 2021-07-20 ENCOUNTER — Ambulatory Visit
Admission: RE | Admit: 2021-07-20 | Discharge: 2021-07-20 | Disposition: A | Discharge status: Home / Self Care | Payer: BC Managed Care – PPO | Source: Ambulatory Visit | Attending: Obstetrics and Gynecology | Admitting: Obstetrics and Gynecology

## 2021-07-20 DIAGNOSIS — Z1231 Encounter for screening mammogram for malignant neoplasm of breast: Secondary | ICD-10-CM | POA: Insufficient documentation

## 2022-06-14 ENCOUNTER — Other Ambulatory Visit: Payer: Self-pay | Admitting: Obstetrics and Gynecology

## 2022-06-14 DIAGNOSIS — Z1231 Encounter for screening mammogram for malignant neoplasm of breast: Secondary | ICD-10-CM

## 2022-08-04 ENCOUNTER — Ambulatory Visit
Admission: RE | Admit: 2022-08-04 | Discharge: 2022-08-04 | Disposition: A | Discharge status: Home / Self Care | Payer: BC Managed Care – PPO | Source: Ambulatory Visit | Attending: Obstetrics and Gynecology | Admitting: Obstetrics and Gynecology

## 2022-08-04 DIAGNOSIS — Z1231 Encounter for screening mammogram for malignant neoplasm of breast: Secondary | ICD-10-CM | POA: Insufficient documentation

## 2023-06-21 ENCOUNTER — Other Ambulatory Visit: Payer: Self-pay | Admitting: Obstetrics and Gynecology

## 2023-06-21 DIAGNOSIS — Z1231 Encounter for screening mammogram for malignant neoplasm of breast: Secondary | ICD-10-CM

## 2023-08-11 ENCOUNTER — Ambulatory Visit
Admission: RE | Admit: 2023-08-11 | Discharge: 2023-08-11 | Disposition: A | Discharge status: Home / Self Care | Source: Ambulatory Visit | Attending: Obstetrics and Gynecology | Admitting: Obstetrics and Gynecology

## 2023-08-11 DIAGNOSIS — Z1231 Encounter for screening mammogram for malignant neoplasm of breast: Secondary | ICD-10-CM | POA: Diagnosis present
